# Patient Record
Sex: Female | Born: 1950 | Race: White | Hispanic: No | Smoking: Current some day smoker
Health system: Southern US, Community
[De-identification: ages and names within clinical notes are randomized; demographics above are authoritative.]

## PROBLEM LIST (undated history)

## (undated) DIAGNOSIS — I213 ST elevation (STEMI) myocardial infarction of unspecified site: Secondary | ICD-10-CM

## (undated) DIAGNOSIS — E669 Obesity, unspecified: Secondary | ICD-10-CM

## (undated) DIAGNOSIS — I10 Essential (primary) hypertension: Secondary | ICD-10-CM

## (undated) DIAGNOSIS — K30 Functional dyspepsia: Secondary | ICD-10-CM

## (undated) DIAGNOSIS — R079 Chest pain, unspecified: Secondary | ICD-10-CM

## (undated) DIAGNOSIS — I2510 Atherosclerotic heart disease of native coronary artery without angina pectoris: Secondary | ICD-10-CM

## (undated) HISTORY — DX: Functional dyspepsia: K30

## (undated) HISTORY — DX: Atherosclerotic heart disease of native coronary artery without angina pectoris: I25.10

## (undated) HISTORY — PX: AORTIC ARCH ANGIOGRAPHY: CATH118224

## (undated) HISTORY — DX: Obesity, unspecified: E66.9

## (undated) HISTORY — DX: Chest pain, unspecified: R07.9

## (undated) HISTORY — DX: Essential (primary) hypertension: I10

## (undated) HISTORY — DX: ST elevation (STEMI) myocardial infarction of unspecified site: I21.3

## (undated) HISTORY — PX: CORONARY ANGIOPLASTY WITH STENT PLACEMENT: SHX49

---

## 2020-09-20 ENCOUNTER — Telehealth: Payer: Self-pay | Admitting: General Practice

## 2020-09-20 NOTE — Telephone Encounter (Signed)
Patient is trying to schedule a new patient appointment. She has had a cardiologist before in [place] and was told to she needs a referral to schedule with [place]. She contacted her Cardiologist's office and they state that they need our office to fax over a request for records with our letterhead on it. You can fax that request to [phone] ATTN: Medical Records.

## 2020-11-15 ENCOUNTER — Telehealth: Payer: Self-pay

## 2020-11-15 NOTE — Telephone Encounter (Signed)
Received records for [facility] for new patient consult.

## 2020-12-01 ENCOUNTER — Encounter: Payer: Self-pay | Admitting: Interventional Cardiology

## 2020-12-01 ENCOUNTER — Ambulatory Visit: Payer: Medicare HMO | Admitting: Interventional Cardiology

## 2020-12-01 ENCOUNTER — Other Ambulatory Visit: Payer: Self-pay

## 2020-12-01 ENCOUNTER — Encounter (INDEPENDENT_AMBULATORY_CARE_PROVIDER_SITE_OTHER): Payer: Self-pay

## 2020-12-01 VITALS — BP 140/88 | HR 79 | Ht 65.0 in | Wt 211.0 lb

## 2020-12-01 DIAGNOSIS — I25118 Atherosclerotic heart disease of native coronary artery with other forms of angina pectoris: Secondary | ICD-10-CM

## 2020-12-01 DIAGNOSIS — I252 Old myocardial infarction: Secondary | ICD-10-CM | POA: Diagnosis not present

## 2020-12-01 DIAGNOSIS — E785 Hyperlipidemia, unspecified: Secondary | ICD-10-CM | POA: Diagnosis not present

## 2020-12-01 DIAGNOSIS — E039 Hypothyroidism, unspecified: Secondary | ICD-10-CM

## 2020-12-01 DIAGNOSIS — I10 Essential (primary) hypertension: Secondary | ICD-10-CM

## 2020-12-01 DIAGNOSIS — R5383 Other fatigue: Secondary | ICD-10-CM

## 2020-12-01 MED ORDER — CLOPIDOGREL BISULFATE 75 MG PO TABS: 75.0000 mg | ORAL_TABLET | Freq: Every day | ORAL | 3 refills | Status: DC

## 2020-12-01 MED ORDER — HYDROCHLOROTHIAZIDE 12.5 MG PO CAPS: 12.5000 mg | ORAL_CAPSULE | Freq: Every day | ORAL | 3 refills | Status: AC

## 2020-12-01 MED ORDER — ROSUVASTATIN CALCIUM 40 MG PO TABS: 40.0000 mg | ORAL_TABLET | Freq: Every day | ORAL | 3 refills | Status: AC

## 2020-12-01 MED ORDER — AVAPRO 150 MG PO TABS: 150.0000 mg | ORAL_TABLET | Freq: Every day | ORAL | 3 refills | Status: DC

## 2020-12-01 NOTE — Patient Instructions (Signed)
Medication Instructions:  1) DISCONTINUE Metoprolol 2) START Rosuvastatin 40mg  once daily 3) START Avapro 150mg  once daily 4) START Plavix 75mg  once daily 5) START Hydrochlorothiazide 12.5mg  once daily  *If you need a refill on your cardiac medications before your next appointment, please call your pharmacy*   Lab Work: Lipid, Liver, BMET and TSH in 1-2 weeks.  You will need to be fasting for these labs (nothing to eat or drink after midnight except water and black coffee).  If you have labs (blood work) drawn today and your tests are completely normal, you will receive your results only by: MyChart Message (if you have MyChart) OR . A paper copy in the mail If you have any lab test that is abnormal or we need to change your treatment, we will call you to review the results.   Testing/Procedures: None   Follow-Up: At [facility], you and your health needs are our priority.  As part of our continuing mission to provide you with exceptional heart care, we have created designated Provider Care Teams.  These Care Teams include your primary Cardiologist (physician) and Advanced Practice Providers (APPs -  Physician Assistants and Nurse Practitioners) who all work together to provide you with the care you need, when you need it.  We recommend signing up for the patient portal called "MyChart".  Sign up information is provided on this After Visit Summary.  MyChart is used to connect with patients for Virtual Visits (Telemedicine).  Patients are able to view lab/test results, encounter notes, upcoming appointments, etc.  Non-urgent messages can be sent to your provider as well.   To learn more about what you can do with MyChart, go to .    Your next appointment:   3 month(s)  The format for your next appointment:   In Person  Provider:   You may see Dr. Marland Kitchen or one of the following Advanced Practice Providers on your designated Care Team:     [facility], PA-C  [email or web address], PA-C    Other Instructions  You have been referred to Cardiac Rehab.

## 2020-12-01 NOTE — Progress Notes (Signed)
Cardiology Office Note   Date:  12/01/2020   ID:  Lacey Martin, DOB 1950/09/14, MRN [id number]  PCP:  Pcp, No    No chief complaint on file.  CAD  Wt Readings from Last 3 Encounters:  12/01/20 211 lb (95.7 kg)       History of Present Illness: Lacey Martin is a 70 y.o. female who is being seen today for the evaluation of CAD at the request of the patient.  Her sister is a patient here.   Review of prior records show that in November 2021, she presented to [facility] health in [facility] with burning indigestion.  ECG revealed inferior ST elevation MI.  She had 4.0 x 18 mm, 3.5 x 26 mm Resolute stents to the RCA. She underwent emergent catheterization and PTCA of the proximal and mid right coronary artery.  Echocardiogram done at that time showed LVEF 50 to 55%.  Normal mitral valve and aortic valve structure and function.  Normal aorta size.  No atrial level shunt.  Normal RV function.  Normal atrial sizes.  She was placed on Lipitor 80 mg daily.  She was discharged home the following day.  She was counseled to stop smoking.  At that time, LDL was 145.  Triglycerides 173.  Total cholesterol 223.  HDL 43.  Creatinine was 0.92.  She was sent home on atorvastatin 80 mg daily, aspirin 81 mg daily, metoprolol 25 mg twice daily, Brilinta 90 mg twice daily and losartan 50 mg daily.  She is here to have local follow-up in [place].  She is originally from [place], but moved to [place] in 3/22.   She retired from [employer] 4 years ago.   She is cut back on cigarette smoking.  Down to 2-3 cigs, from 1 ppd.   Since the MI, she has done well in general.  She never did cardiac rehab.  She does walk.  She is trying to lose weight.  She feels that her energy level is less.  She feels fatigued.     Past Medical History:  Diagnosis Date  . CAD (coronary artery disease)   . Chest pain   . Hypertension   . Indigestion   . Obesity   . ST elevation (STEMI) myocardial infarction [facility])         Current Outpatient Medications  Medication Sig Dispense Refill  . AVAPRO 150 MG tablet Take 1 tablet (150 mg total) by mouth daily. 90 tablet 3  . clopidogrel (PLAVIX) 75 MG tablet Take 1 tablet (75 mg total) by mouth daily. 90 tablet 3  . hydrochlorothiazide (MICROZIDE) 12.5 MG capsule Take 1 capsule (12.5 mg total) by mouth daily. 90 capsule 3  . rosuvastatin (CRESTOR) 40 MG tablet Take 1 tablet (40 mg total) by mouth daily. 90 tablet 3  . aspirin EC 81 MG tablet Take 81 mg by mouth daily. Swallow whole.    . clonazePAM (KLONOPIN) 0.5 MG tablet Take by mouth daily.    . metoprolol tartrate (LOPRESSOR) 25 MG tablet Take 25 mg by mouth 2 (two) times daily.     No current facility-administered medications for this visit.    Allergies:   Penicillins    Social History:  The patient  reports that she has been smoking. She has never used smokeless tobacco. She reports that she does not drink alcohol and does not use drugs.   Family History:  The patient's family history includes Heart disease in her sister.  ROS:  Please see the history of present illness.   Otherwise, review of systems are positive for nausea on meds, fatigue.   All other systems are reviewed and negative.    PHYSICAL EXAM: VS:  BP 140/88   Pulse 79   Wt 211 lb (95.7 kg)   SpO2 95%  , BMI There is no height or weight on file to calculate BMI. GEN: Well nourished, well developed, in no acute distress  HEENT: normal  Neck: no JVD, carotid bruits, or masses Cardiac: RRR; no murmurs, rubs, or gallops,no edema  Respiratory:  clear to auscultation bilaterally, normal work of breathing GI: soft, nontender, nondistended, + BS MS: no deformity or atrophy  Skin: warm and dry, no rash Neuro:  Strength and sensation are intact Psych: euthymic mood, full affect   EKG:   The ekg ordered today demonstrates NSR, inferior Q waves, nonspecific ST changes   Recent Labs: No results found for requested labs within  last 8760 hours.   Lipid Panel No results found for: CHOL, TRIG, HDL, CHOLHDL, VLDL, LDLCALC, LDLDIRECT   Other studies Reviewed: Additional studies/ records that were reviewed today with results demonstrating: Hospital records from [place] reviewed.   ASSESSMENT AND PLAN:  1. CAD/old MI: no angina.  Nonspecific fatigue.  Cardiac rehab. I spoke about the rationale of all of her cardiac meds.  Start Clopidogrel 75 mg daily.  SHe does not want DAPT due to her sister having a bleeding issue.  She will stop aspirin 1 week after starting clopidogrel. I explained the risk of stent thrombosis.  2. Hypertension: Low-salt diet.  Avoid processed foods.The current medical regimen is effective;  continue present plan and medications. She is not taking her losartan.  She wants to try avapro and wants a diuretic.  She "feels tight." Will ad HCTZ for BP as well.  3. Hyperlipidemia: Not taking atorvastatin.  She feels that she had nausea.  Will try rosuvastatin.   4. Tobacco abuse: She tried patches and gum in the past.  Trying to quit completely.  5. Hypothyroid: Check in TSH in a few weeks.  She no longer takes Synthroid.  THis may be the cause of her fatigue.   She will add the medicines one by one, starting with clopidogrel, and Avapro, then HCTZ, then rosuvastatin.  Bmet, liver, lipids, TSH in a few weeks after she has been on the Avapro and HCTZ.  Normal renal function noted in [place].   Current medicines are reviewed at length with the patient today.  The patient concerns regarding her medicines were addressed.  The following changes have been made:  No change  Labs/ tests ordered today include:   Orders Placed This Encounter  Procedures  . Lipid panel  . Hepatic function panel  . Basic metabolic panel  . TSH  . AMB referral to cardiac rehabilitation  . EKG 12-Lead    Recommend 150 minutes/week of aerobic exercise Low fat, low carb, high fiber diet recommended  Disposition:   FU in 3  months   Signed, Lance Muss, MD  12/01/2020 10:45 AM    [facility] [address] [place], [place], [place]  [address] Phone: [phone]; Fax: [phone]

## 2020-12-11 ENCOUNTER — Encounter (HOSPITAL_COMMUNITY): Payer: Self-pay | Admitting: *Deleted

## 2020-12-11 NOTE — Progress Notes (Addendum)
Received referral from Dr. Eldridge Dace for this pt to participate in Cardiac rehab s/p 05/2020 STEMI;DES x 1 RCA at [facility]. Clinical review for initial consult establishing care with Cardiology appt on 5/27 with Dr. Eldridge Dace. Pt appropriate for scheduling when able to due to extended waitlist for scheduling for on site cardiac rehab and/or enrollment in Virtual Cardiac Rehab.  Pt Covid Risk Score is 4.  Will forward to staff for follow up. Alanson Aly, BSN Cardiac and Emergency planning/management officer

## 2020-12-12 ENCOUNTER — Emergency Department (HOSPITAL_BASED_OUTPATIENT_CLINIC_OR_DEPARTMENT_OTHER)
Admission: EM | Admit: 2020-12-12 | Discharge: 2020-12-12 | Disposition: A | Discharge status: Home / Self Care | Payer: Medicare HMO | Attending: Emergency Medicine | Admitting: Emergency Medicine

## 2020-12-12 ENCOUNTER — Other Ambulatory Visit: Payer: Self-pay

## 2020-12-12 ENCOUNTER — Encounter (HOSPITAL_BASED_OUTPATIENT_CLINIC_OR_DEPARTMENT_OTHER): Payer: Self-pay

## 2020-12-12 ENCOUNTER — Emergency Department (HOSPITAL_BASED_OUTPATIENT_CLINIC_OR_DEPARTMENT_OTHER): Payer: Medicare HMO

## 2020-12-12 DIAGNOSIS — R202 Paresthesia of skin: Secondary | ICD-10-CM | POA: Diagnosis not present

## 2020-12-12 DIAGNOSIS — F17200 Nicotine dependence, unspecified, uncomplicated: Secondary | ICD-10-CM | POA: Diagnosis not present

## 2020-12-12 DIAGNOSIS — M549 Dorsalgia, unspecified: Secondary | ICD-10-CM | POA: Diagnosis not present

## 2020-12-12 DIAGNOSIS — R200 Anesthesia of skin: Secondary | ICD-10-CM

## 2020-12-12 DIAGNOSIS — I2510 Atherosclerotic heart disease of native coronary artery without angina pectoris: Secondary | ICD-10-CM | POA: Diagnosis not present

## 2020-12-12 DIAGNOSIS — I10 Essential (primary) hypertension: Secondary | ICD-10-CM | POA: Diagnosis not present

## 2020-12-12 DIAGNOSIS — G8929 Other chronic pain: Secondary | ICD-10-CM | POA: Diagnosis not present

## 2020-12-12 NOTE — ED Provider Notes (Signed)
Emergency Department Provider Note   I have reviewed the triage vital signs and the nursing notes.   HISTORY  Chief Complaint leg numbness   HPI Lacey Martin is a 70 y.o. female medical history of CAD, hypertension, elevated BMI presents to the emergency department with numbness in the right leg for the past 36 hours.  She first noticed it yesterday when she was drying off her leg after shower.  She states it felt like "nothing" which was jarring to her.  She did not feel weakness in the leg.  No trouble walking.  No arm or face symptoms.  No speech disturbance.  She initially felt like maybe it had something to do with her broken toe from earlier this year but symptoms go all the way up the leg.  She has chronic back pain but states is not worse than normal.  No bowel or bladder incontinence.  No groin numbness.   Past Medical History:  Diagnosis Date   CAD (coronary artery disease)    Chest pain    Hypertension    Indigestion    Obesity    ST elevation (STEMI) myocardial infarction (HCC)     There are no problems to display for this patient.   Past Surgical History:  Procedure Laterality Date   AORTIC ARCH ANGIOGRAPHY      Allergies Penicillins  Family History  Problem Relation Age of Onset   Heart disease Sister     Social History Social History   Tobacco Use   Smoking status: Current Some Day Smoker   Smokeless tobacco: Never Used   Tobacco comment: 2-3 daily  Substance Use Topics   Alcohol use: Never   Drug use: Never    Review of Systems  Constitutional: No fever/chills Eyes: No visual changes. ENT: No sore throat. Cardiovascular: Denies chest pain. Respiratory: Denies shortness of breath. Gastrointestinal: No abdominal pain.  No nausea, no vomiting.  No diarrhea.  No constipation. Genitourinary: Negative for dysuria. Musculoskeletal: Chronic back pain. Skin: Negative for rash. Neurological: Negative for headaches. Positive right leg numbness.    10-point ROS otherwise negative.  ____________________________________________   PHYSICAL EXAM:  VITAL SIGNS: ED Triage Vitals  Enc Vitals Group     BP 12/12/20 1622 (!) 173/104     Pulse Rate 12/12/20 1622 90     Resp 12/12/20 1622 18     Temp 12/12/20 1622 97.9 F (36.6 C)     Temp Source 12/12/20 1622 Oral     SpO2 12/12/20 1621 94 %     Weight 12/12/20 1623 210 lb 15.7 oz (95.7 kg)     Height 12/12/20 1623 5\' 5"  (1.651 m)   Constitutional: Alert and oriented. Well appearing and in no acute distress. Eyes: Conjunctivae are normal.  Head: Atraumatic. Nose: No congestion/rhinnorhea. Mouth/Throat: Mucous membranes are moist.  Neck: No stridor. Cardiovascular: Normal rate, regular rhythm. Good peripheral circulation. Grossly normal heart sounds.   Respiratory: Normal respiratory effort.  No retractions. Lungs CTAB. Gastrointestinal: Soft and nontender. No distention.  Musculoskeletal: No lower extremity tenderness nor edema. No gross deformities of extremities. Neurologic:  Normal speech and language. Decreased sensation to the right leg thigh to foot in multiple dermatomes. Normal strength/sensation in the RUE. No facial asymmetry.  Skin:  Skin is warm, dry and intact. No rash noted.   ____________________________________________   LABS (all labs ordered are listed, but only abnormal results are displayed)  Labs Reviewed  BASIC METABOLIC PANEL  CBC WITH DIFFERENTIAL/PLATELET  MAGNESIUM   ____________________________________________  RADIOLOGY  CT Head Wo Contrast  Result Date: 12/12/2020 CLINICAL DATA:  Decreased sensation in the right lower leg since yesterday. Clinical concern for cerebral infarction. EXAM: CT HEAD WITHOUT CONTRAST TECHNIQUE: Contiguous axial images were obtained from the base of the skull through the vertex without intravenous contrast. COMPARISON:  None. FINDINGS: Brain: Mildly enlarged ventricles and cortical sulci. Small, old lacunar infarct  or prominent perivascular space in the corona radiata on the right at the lateral margin of the corpus callosum. No intracranial hemorrhage, mass lesion or CT evidence of acute infarction. Vascular: No hyperdense vessel or unexpected calcification. Skull: Normal. Negative for fracture or focal lesion. Sinuses/Orbits: Unremarkable. Other: None. IMPRESSION: No acute abnormality. Electronically Signed   By: Beckie Salts M.D.   On: 12/12/2020 17:22    ____________________________________________   PROCEDURES  Procedure(s) performed:   Procedures  None  ____________________________________________   INITIAL IMPRESSION / ASSESSMENT AND PLAN / ED COURSE  Pertinent labs & imaging results that were available during my care of the patient were reviewed by me and considered in my medical decision making (see chart for details).   Patient presents to the emergency department with right leg numbness.  No weakness.  Symptoms are 36 hours from onset.  She is outside the window for any kind of stroke intervention.  Doubt spine related etiology with chronic pain and not worsening.  She does have decreased sensation in the leg on my exam.  No right arm or face symptoms.   CT imaging with no acute findings. Labs pending. Patient eloped prior to labs and consideration of transfer for MRI.  ____________________________________________  FINAL CLINICAL IMPRESSION(S) / ED DIAGNOSES  Final diagnoses:  Left leg numbness    Note:  This document was prepared using Dragon voice recognition software and may include unintentional dictation errors.  Alona Bene, MD, [facility] Emergency Medicine    Stori Royse, Arlyss Repress, MD 12/14/20 [phone]

## 2020-12-12 NOTE — ED Triage Notes (Addendum)
Patient reports decreased sensation to right lower leg since yesterday, denies pain, denies injury, does not effect her gait.  Patient reports "breaking her right big toe" about a month ago and had RLE pain after that, since resolved.  Was never seen for the toe injury.

## 2020-12-13 ENCOUNTER — Emergency Department (HOSPITAL_COMMUNITY)
Admission: EM | Admit: 2020-12-13 | Discharge: 2020-12-13 | Disposition: A | Discharge status: Home / Self Care | Payer: Medicare HMO | Attending: Physician Assistant | Admitting: Physician Assistant

## 2020-12-13 ENCOUNTER — Other Ambulatory Visit: Payer: Self-pay

## 2020-12-13 ENCOUNTER — Encounter (HOSPITAL_COMMUNITY): Payer: Self-pay

## 2020-12-13 DIAGNOSIS — R200 Anesthesia of skin: Secondary | ICD-10-CM | POA: Insufficient documentation

## 2020-12-13 DIAGNOSIS — Z5321 Procedure and treatment not carried out due to patient leaving prior to being seen by health care provider: Secondary | ICD-10-CM | POA: Diagnosis not present

## 2020-12-13 LAB — URINALYSIS, ROUTINE W REFLEX MICROSCOPIC
Bacteria, UA: NONE SEEN
Bilirubin Urine: NEGATIVE
Glucose, UA: 50 mg/dL — AB
Ketones, ur: NEGATIVE mg/dL
Leukocytes,Ua: NEGATIVE
Nitrite: NEGATIVE
Protein, ur: NEGATIVE mg/dL
Specific Gravity, Urine: 1.019 (ref 1.005–1.030)
pH: 6 (ref 5.0–8.0)

## 2020-12-13 LAB — COMPREHENSIVE METABOLIC PANEL
ALT: 106 U/L — ABNORMAL HIGH (ref 0–44)
AST: 74 U/L — ABNORMAL HIGH (ref 15–41)
Albumin: 3.9 g/dL (ref 3.5–5.0)
Alkaline Phosphatase: 134 U/L — ABNORMAL HIGH (ref 38–126)
Anion gap: 10 (ref 5–15)
BUN: 13 mg/dL (ref 8–23)
CO2: 23 mmol/L (ref 22–32)
Calcium: 9.6 mg/dL (ref 8.9–10.3)
Chloride: 104 mmol/L (ref 98–111)
Creatinine, Ser: 0.79 mg/dL (ref 0.44–1.00)
GFR, Estimated: >60 mL/min (ref >60)
Glucose, Bld: 198 mg/dL — ABNORMAL HIGH (ref 70–99)
Potassium: 4 mmol/L (ref 3.5–5.1)
Sodium: 137 mmol/L (ref 135–145)
Total Bilirubin: 0.3 mg/dL (ref 0.3–1.2)
Total Protein: 6.8 g/dL (ref 6.5–8.1)

## 2020-12-13 LAB — PROTIME-INR
INR: 1 (ref 0.8–1.2)
Prothrombin Time: 13.1 seconds (ref 11.4–15.2)

## 2020-12-13 LAB — DIFFERENTIAL
Abs Immature Granulocytes: 0.02 10*3/uL (ref 0.00–0.07)
Basophils Absolute: 0.1 10*3/uL (ref 0.0–0.1)
Basophils Relative: 1 %
Eosinophils Absolute: 0.2 10*3/uL (ref 0.0–0.5)
Eosinophils Relative: 3 %
Immature Granulocytes: 0 %
Lymphocytes Relative: 23 %
Lymphs Abs: 1.5 10*3/uL (ref 0.7–4.0)
Monocytes Absolute: 0.4 10*3/uL (ref 0.1–1.0)
Monocytes Relative: 6 %
Neutro Abs: 4.5 10*3/uL (ref 1.7–7.7)
Neutrophils Relative %: 67 %

## 2020-12-13 LAB — CBC
HCT: 44.5 % (ref 36.0–46.0)
Hemoglobin: 14.1 g/dL (ref 12.0–15.0)
MCH: 27.7 pg (ref 26.0–34.0)
MCHC: 31.7 g/dL (ref 30.0–36.0)
MCV: 87.4 fL (ref 80.0–100.0)
Platelets: 157 10*3/uL (ref 150–400)
RBC: 5.09 MIL/uL (ref 3.87–5.11)
RDW: 13.3 % (ref 11.5–15.5)
WBC: 6.8 10*3/uL (ref 4.0–10.5)
nRBC: 0 % (ref 0.0–0.2)

## 2020-12-13 LAB — APTT: aPTT: 31 seconds (ref 24–36)

## 2020-12-13 NOTE — ED Notes (Signed)
Patient states she had ct done yesterday and blood work today and she cant stay any longer d/t age and how long she might still potentially have to wait

## 2020-12-13 NOTE — ED Provider Notes (Signed)
Emergency Medicine Provider Triage Evaluation Note  Lacey Martin , a 70 y.o. female  was evaluated in triage.  Pt complains of numbness to the RLE that started 2 days ago. She further reports some mild weakness on that side as well. She was seen at [facility] yesterday and had a neg ct scan. She left prior to labwork and consideration for mri  Review of Systems  Positive: Right leg numbness, weakness Negative: Vision changes  Physical Exam  There were no vitals taken for this visit. Gen:   Awake, no distress   Resp:  Normal effort  MSK:   Moves extremities without difficulty  Other:  Decreased strength to the RLE, 5/5 strength to the bue/ble, cranial nerves II-XII intact  Medical Decision Making  Medically screening exam initiated at 9:13 PM.  Appropriate orders placed.  DAYAMI TAITT was informed that the remainder of the evaluation will be completed by another provider, this initial triage assessment does not replace that evaluation, and the importance of remaining in the ED until their evaluation is complete.   Rayne Du 12/13/20 2113    Pricilla Loveless, MD 12/13/20 [phone]

## 2020-12-13 NOTE — ED Triage Notes (Signed)
Pt reports that for the last two days she has been having numbness in her R leg, states feels a little weaker.

## 2020-12-15 ENCOUNTER — Telehealth: Payer: Self-pay | Admitting: *Deleted

## 2020-12-15 ENCOUNTER — Other Ambulatory Visit: Payer: Medicare HMO | Admitting: *Deleted

## 2020-12-15 ENCOUNTER — Other Ambulatory Visit: Payer: Self-pay

## 2020-12-15 DIAGNOSIS — R7401 Elevation of levels of liver transaminase levels: Secondary | ICD-10-CM

## 2020-12-15 DIAGNOSIS — E785 Hyperlipidemia, unspecified: Secondary | ICD-10-CM

## 2020-12-15 LAB — BASIC METABOLIC PANEL
BUN/Creatinine Ratio: 19 (ref 12–28)
BUN: 15 mg/dL (ref 8–27)
CO2: 21 mmol/L (ref 20–29)
Calcium: 9.7 mg/dL (ref 8.7–10.3)
Chloride: 101 mmol/L (ref 96–106)
Creatinine, Ser: 0.79 mg/dL (ref 0.57–1.00)
Glucose: 214 mg/dL — ABNORMAL HIGH (ref 65–99)
Potassium: 4.1 mmol/L (ref 3.5–5.2)
Sodium: 138 mmol/L (ref 134–144)
eGFR: 81 mL/min/{1.73_m2} (ref >59)

## 2020-12-15 LAB — HEPATIC FUNCTION PANEL
ALT: 94 IU/L — ABNORMAL HIGH (ref 0–32)
AST: 48 IU/L — ABNORMAL HIGH (ref 0–40)
Albumin: 4.2 g/dL (ref 3.8–4.8)
Alkaline Phosphatase: 141 IU/L — ABNORMAL HIGH (ref 44–121)
Bilirubin Total: 0.5 mg/dL (ref 0.0–1.2)
Bilirubin, Direct: 0.21 mg/dL (ref 0.00–0.40)
Total Protein: 6.5 g/dL (ref 6.0–8.5)

## 2020-12-15 LAB — LIPID PANEL
Chol/HDL Ratio: 3.5 ratio (ref 0.0–4.4)
Cholesterol, Total: 179 mg/dL (ref 100–199)
HDL: 51 mg/dL (ref >39)
LDL Chol Calc (NIH): 101 mg/dL — ABNORMAL HIGH (ref 0–99)
Triglycerides: 154 mg/dL — ABNORMAL HIGH (ref 0–149)
VLDL Cholesterol Cal: 27 mg/dL (ref 5–40)

## 2020-12-15 LAB — TSH: TSH: 5.65 u[IU]/mL — ABNORMAL HIGH (ref 0.450–4.500)

## 2020-12-15 NOTE — Telephone Encounter (Signed)
I spoke with patient and reviewed lab results with her.  She has synthroid at home so will resume.  She does not have a PCP but will be seeing a NP at [facility] in a couple of weeks.  Dr Eldridge Dace would like to recheck liver function tests in 2-3 weeks.  Patient will come in for lab work on June 27,2022.   Patient reports she has been having numbness on right side.  Was evaluated recently in ED for this.  I advised her to go back to ED if her symptoms worsened.

## 2020-12-15 NOTE — Telephone Encounter (Signed)
-----   Message from Corky Crafts, MD sent at 12/15/2020  5:32 PM EDT ----- Still showing that her thyroid is underactive and she needs synthroid.  I think she had stopped taking this medicine in a general effort to take less medicine. Please cc her PMD.

## 2021-01-01 ENCOUNTER — Other Ambulatory Visit: Payer: Medicare HMO

## 2021-01-04 ENCOUNTER — Telehealth (HOSPITAL_COMMUNITY): Payer: Self-pay

## 2021-01-04 NOTE — Telephone Encounter (Signed)
Attempted to call patient in regards to Cardiac Rehab - LM on VM 

## 2021-01-04 NOTE — Telephone Encounter (Signed)
Called patient to see if she is interested in the Cardiac Rehab Program. Patient expressed interest. Explained scheduling process and went over insurance, patient verbalized understanding. Will contact patient at a later time to schedule.

## 2021-01-04 NOTE — Telephone Encounter (Signed)
Pt insurance is active and benefits verified through [facility] Co-pay $45, DED 0/0 met, out of pocket $4,500/$262.42 met, co-insurance 0%. no pre-authorization required. Passport, 01/04/2021@9 :45am, REF# [id number]   Will contact patient to see if she is interested in the Cardiac Rehab Program. If interested, patient will need to complete follow up appt. Once completed, patient will be contacted for scheduling upon review by the RN Navigator.

## 2021-01-10 ENCOUNTER — Encounter (HOSPITAL_COMMUNITY): Payer: Self-pay

## 2021-01-10 NOTE — Telephone Encounter (Signed)
Attempted to call patient in regards to Cardiac Rehab - LM on VM Mailed letter 

## 2021-02-10 ENCOUNTER — Emergency Department (HOSPITAL_BASED_OUTPATIENT_CLINIC_OR_DEPARTMENT_OTHER)
Admission: EM | Admit: 2021-02-10 | Discharge: 2021-02-10 | Disposition: A | Discharge status: Home / Self Care | Payer: Medicare HMO | Attending: Emergency Medicine | Admitting: Emergency Medicine

## 2021-02-10 ENCOUNTER — Emergency Department (HOSPITAL_BASED_OUTPATIENT_CLINIC_OR_DEPARTMENT_OTHER): Payer: Medicare HMO | Admitting: Radiology

## 2021-02-10 ENCOUNTER — Emergency Department (HOSPITAL_BASED_OUTPATIENT_CLINIC_OR_DEPARTMENT_OTHER): Payer: Medicare HMO

## 2021-02-10 ENCOUNTER — Encounter (HOSPITAL_BASED_OUTPATIENT_CLINIC_OR_DEPARTMENT_OTHER): Payer: Self-pay

## 2021-02-10 DIAGNOSIS — R0789 Other chest pain: Secondary | ICD-10-CM | POA: Diagnosis not present

## 2021-02-10 DIAGNOSIS — Z955 Presence of coronary angioplasty implant and graft: Secondary | ICD-10-CM | POA: Insufficient documentation

## 2021-02-10 DIAGNOSIS — I2510 Atherosclerotic heart disease of native coronary artery without angina pectoris: Secondary | ICD-10-CM | POA: Diagnosis not present

## 2021-02-10 DIAGNOSIS — Z7982 Long term (current) use of aspirin: Secondary | ICD-10-CM | POA: Diagnosis not present

## 2021-02-10 DIAGNOSIS — F17200 Nicotine dependence, unspecified, uncomplicated: Secondary | ICD-10-CM | POA: Diagnosis not present

## 2021-02-10 DIAGNOSIS — Z79899 Other long term (current) drug therapy: Secondary | ICD-10-CM | POA: Insufficient documentation

## 2021-02-10 DIAGNOSIS — R079 Chest pain, unspecified: Secondary | ICD-10-CM

## 2021-02-10 DIAGNOSIS — I10 Essential (primary) hypertension: Secondary | ICD-10-CM | POA: Insufficient documentation

## 2021-02-10 DIAGNOSIS — Z7902 Long term (current) use of antithrombotics/antiplatelets: Secondary | ICD-10-CM | POA: Diagnosis not present

## 2021-02-10 LAB — CBC
HCT: 43.9 % (ref 36.0–46.0)
Hemoglobin: 14.3 g/dL (ref 12.0–15.0)
MCH: 28.2 pg (ref 26.0–34.0)
MCHC: 32.6 g/dL (ref 30.0–36.0)
MCV: 86.6 fL (ref 80.0–100.0)
Platelets: 157 10*3/uL (ref 150–400)
RBC: 5.07 MIL/uL (ref 3.87–5.11)
RDW: 14.3 % (ref 11.5–15.5)
WBC: 5.8 10*3/uL (ref 4.0–10.5)
nRBC: 0 % (ref 0.0–0.2)

## 2021-02-10 LAB — BASIC METABOLIC PANEL
Anion gap: 8 (ref 5–15)
BUN: 16 mg/dL (ref 8–23)
CO2: 25 mmol/L (ref 22–32)
Calcium: 9.5 mg/dL (ref 8.9–10.3)
Chloride: 106 mmol/L (ref 98–111)
Creatinine, Ser: 0.79 mg/dL (ref 0.44–1.00)
GFR, Estimated: >60 mL/min (ref >60)
Glucose, Bld: 170 mg/dL — ABNORMAL HIGH (ref 70–99)
Potassium: 3.6 mmol/L (ref 3.5–5.1)
Sodium: 139 mmol/L (ref 135–145)

## 2021-02-10 LAB — D-DIMER, QUANTITATIVE: D-Dimer, Quant: 0.56 ug/mL-FEU — ABNORMAL HIGH (ref 0.00–0.50)

## 2021-02-10 LAB — TROPONIN I (HIGH SENSITIVITY)
Troponin I (High Sensitivity): 7 ng/L (ref <18)
Troponin I (High Sensitivity): 6 ng/L (ref <18)

## 2021-02-10 MED ORDER — NITROGLYCERIN 0.4 MG SL SUBL
0.4000 mg | SUBLINGUAL_TABLET | SUBLINGUAL | Status: DC | PRN
  Administered 2021-02-10 (×3): 0.4 mg via SUBLINGUAL
  Filled 2021-02-10: qty 1

## 2021-02-10 MED ORDER — IOHEXOL 350 MG/ML SOLN
100.0000 mL | Freq: Once | INTRAVENOUS | Status: AC | PRN
  Administered 2021-02-10: 100 mL via INTRAVENOUS

## 2021-02-10 MED ORDER — FENTANYL CITRATE (PF) 100 MCG/2ML IJ SOLN
50.0000 ug | Freq: Once | INTRAMUSCULAR | Status: AC
  Administered 2021-02-10: 50 ug via INTRAVENOUS
  Filled 2021-02-10: qty 2

## 2021-02-10 MED ORDER — ASPIRIN 81 MG PO CHEW
324.0000 mg | CHEWABLE_TABLET | Freq: Once | ORAL | Status: AC
  Administered 2021-02-10: 324 mg via ORAL
  Filled 2021-02-10: qty 4

## 2021-02-10 NOTE — ED Notes (Signed)
After Dr. Christoper Fabian consult, she spoke at some length with her son; and she ultimately decides to be d/c home. She remains in no distress.

## 2021-02-10 NOTE — ED Triage Notes (Signed)
She states she experienced left-sided chest discomfort after arising to go to the bathroom. She describes it as "It feels like really bad indigestion". Her skin is normal, warm and dry and she is breathing normally.

## 2021-02-10 NOTE — Discharge Instructions (Addendum)
Contact your cardiologist on Monday.  Return to the emergency room if you have any worsening symptoms.

## 2021-02-10 NOTE — ED Provider Notes (Signed)
[facility] EMERGENCY DEPT Provider Note   CSN: [id number] Arrival date & time: 02/10/21  0701     History Chief Complaint  Patient presents with   Chest Pain    Lacey Martin is a 70 y.o. female.  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  This happened in [place].  She presents today with chest pain.  She says it started about 5:00 she was walking to the bathroom this morning.  She describes it as an indigestion type pain in her left chest.  It radiates a little bit down her left arm.  She does not report any shortness of breath but says it hurts a little bit more when she takes a deep breath.  It does not seem to be worse with exertion.  There is no diaphoresis.  She says it feels little different than her prior MI.  She did take a Klonopin prior to arrival but it did not help with the symptoms.  She denies any cough or cold symptoms.  No associated back pain.  No leg pain or swelling.      Past Medical History:  Diagnosis Date   CAD (coronary artery disease)    Chest pain    Hypertension    Indigestion    Obesity    ST elevation (STEMI) myocardial infarction (HCC)     There are no problems to display for this patient.   Past Surgical History:  Procedure Laterality Date   AORTIC ARCH ANGIOGRAPHY     CORONARY ANGIOPLASTY WITH STENT PLACEMENT       OB History   No obstetric history on file.     Family History  Problem Relation Age of Onset   Heart disease Sister     Social History   Tobacco Use   Smoking status: Some Days   Smokeless tobacco: Never   Tobacco comments:    2-3 daily  Substance Use Topics   Alcohol use: Never   Drug use: Never    Home Medications Prior to Admission medications   Medication Sig Start Date End Date Taking? Authorizing Provider  aspirin EC 81 MG tablet Take 81 mg by mouth daily. Swallow whole.    [provider]  AVAPRO 150 MG tablet Take 1 tablet (150 mg total) by mouth daily. 12/01/20   Corky Crafts, MD  clonazePAM (KLONOPIN) 0.5 MG tablet Take by mouth daily.    [provider]  clopidogrel (PLAVIX) 75 MG tablet Take 1 tablet (75 mg total) by mouth daily. 12/01/20   Corky Crafts, MD  hydrochlorothiazide (MICROZIDE) 12.5 MG capsule Take 1 capsule (12.5 mg total) by mouth daily. 12/01/20   Corky Crafts, MD  metoprolol tartrate (LOPRESSOR) 25 MG tablet Take 25 mg by mouth 2 (two) times daily.    [provider]  rosuvastatin (CRESTOR) 40 MG tablet Take 1 tablet (40 mg total) by mouth daily. 12/01/20   Corky Crafts, MD    Allergies    Penicillins  Review of Systems   Review of Systems  Constitutional:  Negative for chills, diaphoresis, fatigue and fever.  HENT:  Negative for congestion, rhinorrhea and sneezing.   Eyes: Negative.   Respiratory:  Negative for cough, chest tightness and shortness of breath.   Cardiovascular:  Positive for chest pain. Negative for leg swelling.  Gastrointestinal:  Negative for abdominal pain, blood in stool, diarrhea, nausea and vomiting.  Genitourinary:  Negative for difficulty urinating, flank pain, frequency and hematuria.  Musculoskeletal:  Negative for arthralgias and back pain.  Skin:  Negative for rash.  Neurological:  Negative for dizziness, speech difficulty, weakness, numbness and headaches.   Physical Exam Updated Vital Signs BP (!) 149/83   Pulse 65   Temp 98.4 F (36.9 C) (Oral)   Resp 14   Ht 5\' 6"  (1.676 m)   Wt 89.4 kg   SpO2 99%   BMI 31.80 kg/m   Physical Exam Constitutional:      Appearance: She is well-developed.  HENT:     Head: Normocephalic and atraumatic.  Eyes:     Pupils: Pupils are equal, round, and reactive to light.  Cardiovascular:     Rate and Rhythm: Normal rate and regular rhythm.     Heart sounds: Normal heart sounds.  Pulmonary:     Effort: Pulmonary effort is  normal. No respiratory distress.     Breath sounds: Normal breath sounds. No wheezing or rales.  Chest:     Chest wall: No tenderness.  Abdominal:     General: Bowel sounds are normal.     Palpations: Abdomen is soft.     Tenderness: There is no abdominal tenderness. There is no guarding or rebound.  Musculoskeletal:        General: Normal range of motion.     Cervical back: Normal range of motion and neck supple.     Comments: No edema or calf tenderness  Lymphadenopathy:     Cervical: No cervical adenopathy.  Skin:    General: Skin is warm and dry.     Findings: No rash.  Neurological:     Mental Status: She is alert and oriented to person, place, and time.    ED Results / Procedures / Treatments   Labs (all labs ordered are listed, but only abnormal results are displayed) Labs Reviewed  BASIC METABOLIC PANEL - Abnormal; Notable for the following components:      Result Value   Glucose, Bld 170 (*)    All other components within normal limits  D-DIMER, QUANTITATIVE - Abnormal; Notable for the following components:   D-Dimer, Quant 0.56 (*)    All other components within normal limits  CBC  TROPONIN I (HIGH SENSITIVITY)  TROPONIN I (HIGH SENSITIVITY)    EKG EKG Interpretation  Date/Time:  Saturday February 10 2021 08:06:32 EDT Ventricular Rate:  73 PR Interval:  193 QRS Duration: 91 QT Interval:  402 QTC Calculation: 443 R Axis:   -27 Text Interpretation: Sinus rhythm Inferior infarct, old Anterior infarct, old Confirmed by Rolan BuccoBelfi, Yesha Muchow [id number]) on 02/10/2021 8:30:33 AM  Radiology DG Chest 2 View  Result Date: 02/10/2021 CLINICAL DATA:  Chest pain. EXAM: CHEST - 2 VIEW COMPARISON:  None. FINDINGS: Few patchy densities near the left costophrenic angle. 10 mm nodular density at the medial right lung base. This is larger than the adjacent vascular structures and suspect this is a pulmonary nodule. There may be an additional 5 mm nodule in the right mid lung which could  be calcified. Heart size is upper limits of normal. Negative for a pneumothorax. Trachea is midline. No large pleural effusions. IMPRESSION: 1. Subtle densities at the left costophrenic angle. Differential diagnosis includes small focus of infection versus atelectasis. 2. Possible 1 cm nodule near the medial right lung base. Recommend further characterization with a chest CT. Electronically Signed   By: Richarda OverlieAdam  Henn M.D.   On: 02/10/2021 08:34  CT Angio Chest PE W/Cm &/Or Wo Cm  Result Date: 02/10/2021 CLINICAL DATA:  70 year old female with LEFT chest pain and positive D-dimer. EXAM: CT ANGIOGRAPHY CHEST WITH CONTRAST TECHNIQUE: Multidetector CT imaging of the chest was performed using the standard protocol during bolus administration of intravenous contrast. Multiplanar CT image reconstructions and MIPs were obtained to evaluate the vascular anatomy. CONTRAST:  OMNIPAQUE IOHEXOL 350 MG/ML SOLN COMPARISON:  Chest radiograph performed today FINDINGS: Cardiovascular: This is a technically adequate study for evaluation of pulmonary emboli. No pulmonary emboli are identified. Ectasias of the thoracic aorta noted with ascending aorta measuring up to 3.8 cm. Coronary artery and aortic atherosclerotic calcifications are present. No pericardial effusion identified. Mediastinum/Nodes: No enlarged mediastinal, hilar, or axillary lymph nodes. Calcified lymph nodes are present, compatible with prior granulomatous disease. Thyroid gland, trachea, and esophagus demonstrate no significant findings. Lungs/Pleura: Mild to moderate bibasilar atelectasis is noted. Calcified granuloma at the RIGHT lung base corresponds to the nodular opacity identified on today's chest radiograph. No pulmonary mass or suspicious nodule identified. No airspace disease, pleural effusion or pneumothorax noted. Upper Abdomen: Hepatic steatosis identified. Equivocal nodularity of the liver is identified with UPPER limits normal spleen size. No  acute abnormalities are present. Musculoskeletal: No acute or suspicious bony abnormalities noted. Review of the MIP images confirms the above findings. IMPRESSION: 1. No evidence of pulmonary emboli. 2. Mild to moderate bibasilar atelectasis. Nodule at the RIGHT lung base on recent chest radiograph represents a benign calcified granuloma. 3. Hepatic steatosis. Equivocal nodularity of the liver and UPPER limits normal spleen size which may suggest cirrhosis. 4. Coronary artery disease. 5. Aortic Atherosclerosis (ICD10-I70.0). Electronically Signed   By: Harmon Pier M.D.   On: 02/10/2021 10:24    Procedures Procedures   Medications Ordered in ED Medications  nitroGLYCERIN (NITROSTAT) SL tablet 0.4 mg (0.4 mg Sublingual Given 02/10/21 0816)  aspirin chewable tablet 324 mg (324 mg Oral Given 02/10/21 0806)  iohexol (OMNIPAQUE) 350 MG/ML injection 100 mL (100 mLs Intravenous Contrast Given 02/10/21 1009)  fentaNYL (SUBLIMAZE) injection 50 mcg (50 mcg Intravenous Given 02/10/21 0936)    ED Course  I have reviewed the triage vital signs and the nursing notes.  Pertinent labs & imaging results that were available during my care of the patient were reviewed by me and considered in my medical decision making (see chart for details).    MDM Rules/Calculators/A&P                           Patient is a 70 year old female who presents with chest pain.  Her EKG does not show any ischemic changes.  She has had 2 negative troponins.  Her D-dimer is slightly elevated and she had a CT scan of her chest which showed no evidence of PE or pneumonia.  Her other labs are nonconcerning.  She did have an elevated heart score and given that she had a recent MI with stent placement, I did recommend that she be admitted to the hospital for further evaluation.  Initially she was uncertain and was thinking about it.  Her son was favoring her being admitted.  She then decided that she was not could be admitted to the hospital.  I  had a long discussion with her and her son regarding the limitations of our study and the reason for admission.  I did advise her that if she leaves today that she would have increased risk for adverse events.  She says she will follow-up with her cardiologist on Monday.  Return precautions were given. Final Clinical Impression(s) / ED Diagnoses Final diagnoses:  Chest pain, unspecified type    Rx / DC Orders ED Discharge Orders     None        Rolan Bucco, MD 02/10/21 1149

## 2021-02-10 NOTE — ED Notes (Signed)
Lab aware to add on D-Dimer test.

## 2021-02-10 NOTE — ED Notes (Signed)
Dr. Fredderick Phenix is made aware of no pain relief from three s.l. ntg tablets.

## 2021-02-10 NOTE — ED Notes (Signed)
Pt transported to bathroom via wheelchair, tolerated well. Transported to CT.

## 2021-02-10 NOTE — ED Notes (Signed)
Rrepeat EKG performed before nitro.

## 2021-02-10 NOTE — ED Notes (Signed)
She returns from CT at this time. She smilingly tells me she feels "better".

## 2021-02-15 ENCOUNTER — Telehealth (HOSPITAL_COMMUNITY): Payer: Self-pay | Admitting: *Deleted

## 2021-02-15 NOTE — Telephone Encounter (Signed)
Called pt 02/15/11. We have Lacey Martin scheduled for PR orientation 02/20/21. In reviewing her records found that she had been in the ED 02/10/21. Called her to get a update on what has happened since she had been in the ED. I was unable to reach her but left a message for her to return the call.

## 2021-02-16 ENCOUNTER — Encounter (HOSPITAL_COMMUNITY): Payer: Self-pay | Admitting: *Deleted

## 2021-02-16 ENCOUNTER — Telehealth (HOSPITAL_COMMUNITY): Payer: Self-pay | Admitting: *Deleted

## 2021-02-16 NOTE — Telephone Encounter (Signed)
Pt has not responded to message left on this past Wednesday 8/10. Called today for follow up and the mailbox is full and I am unable to leave a message.  Sent pt message through [place] requesting please contact regarding her cancelled appt for Cardiac rehab.  Pt must complete post ER visit Cardiology follow up.  Lacey Martin has a previously scheduled appt for 8/25.  I do not see that Dr. Eldridge Dace was contacted per directions given to [name] to do so. Alanson Aly, BSN Cardiac and Emergency planning/management officer

## 2021-02-19 ENCOUNTER — Telehealth (HOSPITAL_COMMUNITY): Payer: Self-pay | Admitting: *Deleted

## 2021-02-19 NOTE — Telephone Encounter (Signed)
Nikolina received message and called me back.  I advised pt that due to her ER visit on 8/6 for chest pain, she will need to complete follow up appt - previously scheduled on 8/25.  Channelle became very upset verbally and indicated that her visit to the ER was GI related and not her heart.  Reviewed with pt the ER progress note which states that she was encouraged to stay overnight for observation and she declined. Pt stated, "That is a lie and it never happened.:  I can;t believe this is happening - this is why I don't trust anyone in the medical field."  "All doctors want to do is give you a pill for this a pill for that."  Tried to interject pt continued to speak with profanity.  Pami asked me if I do come to Cardiac rehab what lies are you going to write about me.  I stated firmly that I do not document anything that is not true or didn't happen. Antonette stated the she did not want to participate in cardiac rehab "I am done with the doctors around here, I am going to stay with my [facility] doctors." Acknowledged to pt that I hear what she is saying and I understand the mistrust of health care professionals that unfortunately happens as a result of "bad apples".  This os true for many professions.  Advised pt on walking 30-45 minutes a day mindful of the temperature and humidly.  Boots remarked that she lives in a beautiful apartment complex that has a fitness center. She will continue to exercise on her own.Alanson Aly, BSN Cardiac and Emergency planning/management officer

## 2021-02-19 NOTE — Telephone Encounter (Signed)
No response from pt with message left on voicemail and message by MyChart.  Called pt emergency contact - Zadie Cleverly - sister. Clydie Braun indicated that Farrin is having difficulty with her phone.  Confidential message given to sister to "please contact [phone] and ask for Jasime Westergren".  Clydie Braun indicated that she did not have any alternate way to reach her sister but would get the message to her. Alanson Aly, BSN Cardiac and Emergency planning/management officer

## 2021-02-20 ENCOUNTER — Inpatient Hospital Stay (HOSPITAL_COMMUNITY): Admission: RE | Admit: 2021-02-20 | Payer: Medicare HMO | Source: Ambulatory Visit

## 2021-02-21 ENCOUNTER — Other Ambulatory Visit: Payer: Self-pay

## 2021-02-21 ENCOUNTER — Emergency Department (HOSPITAL_BASED_OUTPATIENT_CLINIC_OR_DEPARTMENT_OTHER): Payer: Medicare HMO

## 2021-02-21 ENCOUNTER — Encounter (HOSPITAL_BASED_OUTPATIENT_CLINIC_OR_DEPARTMENT_OTHER): Payer: Self-pay

## 2021-02-21 ENCOUNTER — Emergency Department (HOSPITAL_BASED_OUTPATIENT_CLINIC_OR_DEPARTMENT_OTHER)
Admission: EM | Admit: 2021-02-21 | Discharge: 2021-02-21 | Disposition: A | Discharge status: Home / Self Care | Payer: Medicare HMO | Attending: Emergency Medicine | Admitting: Emergency Medicine

## 2021-02-21 DIAGNOSIS — I10 Essential (primary) hypertension: Secondary | ICD-10-CM | POA: Insufficient documentation

## 2021-02-21 DIAGNOSIS — R109 Unspecified abdominal pain: Secondary | ICD-10-CM | POA: Diagnosis not present

## 2021-02-21 DIAGNOSIS — Z7902 Long term (current) use of antithrombotics/antiplatelets: Secondary | ICD-10-CM | POA: Insufficient documentation

## 2021-02-21 DIAGNOSIS — M545 Low back pain, unspecified: Secondary | ICD-10-CM | POA: Insufficient documentation

## 2021-02-21 DIAGNOSIS — I2510 Atherosclerotic heart disease of native coronary artery without angina pectoris: Secondary | ICD-10-CM | POA: Diagnosis not present

## 2021-02-21 DIAGNOSIS — F17200 Nicotine dependence, unspecified, uncomplicated: Secondary | ICD-10-CM | POA: Diagnosis not present

## 2021-02-21 DIAGNOSIS — Z79899 Other long term (current) drug therapy: Secondary | ICD-10-CM | POA: Insufficient documentation

## 2021-02-21 DIAGNOSIS — Z7982 Long term (current) use of aspirin: Secondary | ICD-10-CM | POA: Insufficient documentation

## 2021-02-21 LAB — URINALYSIS, ROUTINE W REFLEX MICROSCOPIC
Bilirubin Urine: NEGATIVE
Glucose, UA: NEGATIVE mg/dL
Ketones, ur: NEGATIVE mg/dL
Leukocytes,Ua: NEGATIVE
Nitrite: NEGATIVE
Specific Gravity, Urine: 1.017 (ref 1.005–1.030)
pH: 6 (ref 5.0–8.0)

## 2021-02-21 LAB — COMPREHENSIVE METABOLIC PANEL
ALT: 56 U/L — ABNORMAL HIGH (ref 0–44)
AST: 30 U/L (ref 15–41)
Albumin: 4.2 g/dL (ref 3.5–5.0)
Alkaline Phosphatase: 110 U/L (ref 38–126)
Anion gap: 9 (ref 5–15)
BUN: 15 mg/dL (ref 8–23)
CO2: 27 mmol/L (ref 22–32)
Calcium: 10.8 mg/dL — ABNORMAL HIGH (ref 8.9–10.3)
Chloride: 102 mmol/L (ref 98–111)
Creatinine, Ser: 0.77 mg/dL (ref 0.44–1.00)
GFR, Estimated: >60 mL/min (ref >60)
Glucose, Bld: 174 mg/dL — ABNORMAL HIGH (ref 70–99)
Potassium: 3.9 mmol/L (ref 3.5–5.1)
Sodium: 138 mmol/L (ref 135–145)
Total Bilirubin: 0.5 mg/dL (ref 0.3–1.2)
Total Protein: 6.9 g/dL (ref 6.5–8.1)

## 2021-02-21 LAB — CBC WITH DIFFERENTIAL/PLATELET
Abs Immature Granulocytes: 0.03 10*3/uL (ref 0.00–0.07)
Basophils Absolute: 0.1 10*3/uL (ref 0.0–0.1)
Basophils Relative: 1 %
Eosinophils Absolute: 0.3 10*3/uL (ref 0.0–0.5)
Eosinophils Relative: 4 %
HCT: 43.3 % (ref 36.0–46.0)
Hemoglobin: 14.2 g/dL (ref 12.0–15.0)
Immature Granulocytes: 1 %
Lymphocytes Relative: 26 %
Lymphs Abs: 1.7 10*3/uL (ref 0.7–4.0)
MCH: 28.2 pg (ref 26.0–34.0)
MCHC: 32.8 g/dL (ref 30.0–36.0)
MCV: 86.1 fL (ref 80.0–100.0)
Monocytes Absolute: 0.7 10*3/uL (ref 0.1–1.0)
Monocytes Relative: 10 %
Neutro Abs: 3.8 10*3/uL (ref 1.7–7.7)
Neutrophils Relative %: 58 %
Platelets: 147 10*3/uL — ABNORMAL LOW (ref 150–400)
RBC: 5.03 MIL/uL (ref 3.87–5.11)
RDW: 14.2 % (ref 11.5–15.5)
WBC: 6.6 10*3/uL (ref 4.0–10.5)
nRBC: 0 % (ref 0.0–0.2)

## 2021-02-21 LAB — LIPASE, BLOOD: Lipase: 13 U/L (ref 11–51)

## 2021-02-21 MED ORDER — MORPHINE SULFATE (PF) 4 MG/ML IV SOLN
4.0000 mg | Freq: Once | INTRAVENOUS | Status: DC
  Filled 2021-02-21: qty 1

## 2021-02-21 MED ORDER — ONDANSETRON HCL 4 MG/2ML IJ SOLN
4.0000 mg | Freq: Once | INTRAMUSCULAR | Status: AC
  Administered 2021-02-21: 4 mg via INTRAVENOUS
  Filled 2021-02-21: qty 2

## 2021-02-21 MED ORDER — METHOCARBAMOL 500 MG PO TABS: 500.0000 mg | ORAL_TABLET | Freq: Three times a day (TID) | ORAL | 0 refills | Status: AC | PRN

## 2021-02-21 MED ORDER — KETOROLAC TROMETHAMINE 30 MG/ML IJ SOLN
15.0000 mg | Freq: Once | INTRAMUSCULAR | Status: AC
  Administered 2021-02-21: 15 mg via INTRAVENOUS
  Filled 2021-02-21: qty 1

## 2021-02-21 NOTE — ED Notes (Signed)
Bilateral BP done. Right arm 159/80 (103). Left arm 163/89 (110).

## 2021-02-21 NOTE — Discharge Instructions (Addendum)
As we discussed, you may have passed a kidney stone. Your CT does not show any kidney stones currently. You declined a CT scan with contrast which would have given more information about the blood vessels. CT did show a nodule in the base of your left lung that should be assessed again in one year.  Take anti-inflammatories and muscle relaxers as prescribed.  Follow-up with your doctor.  Return to the ED with worsening pain, focal weakness, numbness, tingling, bowel or bladder incontinence, chest pain, shortness of breath, any other concerns

## 2021-02-21 NOTE — ED Provider Notes (Signed)
[facility] EMERGENCY DEPT Provider Note   CSN: [id number] Arrival date & time: 02/21/21  0006     History Chief Complaint  Patient presents with   Flank Pain    Left    Lacey Martin is a 70 y.o. female.  Patient with history of CAD with stents, hypertension, diabetes here with left-sided low back pain that onset this evening around 8:30 PM.  States she was driving in her car from [place] and noticed pain when she got out of the car. did not have any injury or fall.  Pain was intermittent but became more severe after she woke up from a nap this evening.  She took Klonopin with partial relief.  The pain is sharp and stabbing in her left flank and low back.  Denies any radiation of the pain down her butt or down her leg.  No bowel or bladder incontinence.  No focal weakness, numbness or tingling.  Has chronic right-sided leg numbness which is unchanged.  No fever or vomiting.  No chest pain or shortness of breath.  No pain with urination or blood in the urine.  No abdominal pain.  She is never had this pain in the past. Concerned she could have a kidney infection or kidney stone  The history is provided by the patient.  Flank Pain Pertinent negatives include no chest pain, no abdominal pain, no headaches and no shortness of breath.      Past Medical History:  Diagnosis Date   CAD (coronary artery disease)    Chest pain    Hypertension    Indigestion    Obesity    ST elevation (STEMI) myocardial infarction (HCC)     There are no problems to display for this patient.   Past Surgical History:  Procedure Laterality Date   AORTIC ARCH ANGIOGRAPHY     CORONARY ANGIOPLASTY WITH STENT PLACEMENT       OB History   No obstetric history on file.     Family History  Problem Relation Age of Onset   Heart disease Sister     Social History   Tobacco Use   Smoking status: Some Days   Smokeless tobacco: Never   Tobacco comments:    2-3 daily  Substance Use  Topics   Alcohol use: Never   Drug use: Never    Home Medications Prior to Admission medications   Medication Sig Start Date End Date Taking? Authorizing Provider  aspirin EC 81 MG tablet Take 81 mg by mouth daily. Swallow whole.    [provider]  AVAPRO 150 MG tablet Take 1 tablet (150 mg total) by mouth daily. 12/01/20   Corky Crafts, MD  clonazePAM (KLONOPIN) 0.5 MG tablet Take by mouth daily.    [provider]  clopidogrel (PLAVIX) 75 MG tablet Take 1 tablet (75 mg total) by mouth daily. 12/01/20   Corky Crafts, MD  hydrochlorothiazide (MICROZIDE) 12.5 MG capsule Take 1 capsule (12.5 mg total) by mouth daily. 12/01/20   Corky Crafts, MD  metoprolol tartrate (LOPRESSOR) 25 MG tablet Take 25 mg by mouth 2 (two) times daily.    [provider]  rosuvastatin (CRESTOR) 40 MG tablet Take 1 tablet (40 mg total) by mouth daily. 12/01/20   Corky Crafts, MD    Allergies    Penicillins  Review of Systems   Review of Systems  Constitutional:  Negative for activity change, appetite change and fever.  HENT:  Negative for congestion.  Respiratory:  Negative for cough, chest tightness and shortness of breath.   Cardiovascular:  Negative for chest pain.  Gastrointestinal:  Negative for abdominal pain, nausea and vomiting.  Genitourinary:  Positive for flank pain. Negative for dysuria, hematuria and urgency.  Musculoskeletal:  Positive for back pain.  Skin:  Negative for rash.  Neurological:  Negative for dizziness, weakness and headaches.   all other systems are negative except as noted in the HPI and PMH.   Physical Exam Updated Vital Signs BP (!) 179/96 (BP Location: Right Arm)   Pulse 90   Temp 98.1 F (36.7 C) (Oral)   Resp 16   Ht 5\' 6"  (1.676 m)   Wt 89.8 kg   SpO2 96%   BMI 31.96 kg/m   Physical Exam Vitals and nursing note reviewed.  Constitutional:      General: She is not in acute distress.    Appearance: She is  well-developed.  HENT:     Head: Normocephalic and atraumatic.     Mouth/Throat:     Pharynx: No oropharyngeal exudate.  Eyes:     Conjunctiva/sclera: Conjunctivae normal.     Pupils: Pupils are equal, round, and reactive to light.  Neck:     Comments: No meningismus. Cardiovascular:     Rate and Rhythm: Normal rate and regular rhythm.     Heart sounds: Normal heart sounds. No murmur heard. Pulmonary:     Effort: Pulmonary effort is normal. No respiratory distress.     Breath sounds: Normal breath sounds.  Abdominal:     Palpations: Abdomen is soft.     Tenderness: There is no abdominal tenderness. There is no guarding or rebound.  Musculoskeletal:        General: Tenderness present. Normal range of motion.     Cervical back: Normal range of motion and neck supple.     Comments: L CVAT, no rash  5/5 strength in bilateral lower extremities. Ankle plantar and dorsiflexion intact. Great toe extension intact bilaterally. +2 DP and PT pulses. +2 patellar reflexes bilaterally. Normal gait.   Skin:    General: Skin is warm.  Neurological:     Mental Status: She is alert and oriented to person, place, and time.     Cranial Nerves: No cranial nerve deficit.     Motor: No abnormal muscle tone.     Coordination: Coordination normal.     Comments:  5/5 strength throughout. CN 2-12 intact.Equal grip strength.   Psychiatric:        Behavior: Behavior normal.    ED Results / Procedures / Treatments   Labs (all labs ordered are listed, but only abnormal results are displayed) Labs Reviewed  URINALYSIS, ROUTINE W REFLEX MICROSCOPIC - Abnormal; Notable for the following components:      Result Value   APPearance HAZY (*)    Hgb urine dipstick SMALL (*)    Protein, ur TRACE (*)    Bacteria, UA RARE (*)    All other components within normal limits  CBC WITH DIFFERENTIAL/PLATELET - Abnormal; Notable for the following components:   Platelets 147 (*)    All other components within normal  limits  COMPREHENSIVE METABOLIC PANEL - Abnormal; Notable for the following components:   Glucose, Bld 174 (*)    Calcium 10.8 (*)    ALT 56 (*)    All other components within normal limits  LIPASE, BLOOD    EKG None  Radiology CT RENAL STONE STUDY  Result Date: 02/21/2021 CLINICAL DATA:  Abdominal pain, aortic dissection suspected. Left flank pain EXAM: CT ABDOMEN AND PELVIS WITHOUT CONTRAST TECHNIQUE: Multidetector CT imaging of the abdomen and pelvis was performed following the standard protocol without IV contrast. COMPARISON:  None. FINDINGS: Lower chest: Calcified granuloma in the right lower lobe. Noncalcified nodule in the left lower lobe measures 7 mm on image 8. Scarring in the left lung base. Hepatobiliary: Several gallstones noted within the gallbladder. Diffuse low-density throughout the liver compatible with fatty infiltration. Scattered calcifications. Pancreas: No focal abnormality or ductal dilatation. Spleen: Scattered calcifications throughout the spleen. Normal size. Adrenals/Urinary Tract: No renal or ureteral stones. No hydronephrosis. Left renal parapelvic cysts. Adrenal glands and urinary bladder unremarkable. Stomach/Bowel: Colonic diverticulosis. No active diverticulitis. Normal appendix. Stomach and small bowel decompressed, unremarkable. Vascular/Lymphatic: Aortic atherosclerosis. No evidence of aneurysm or adenopathy. Reproductive: Uterus and adnexa unremarkable.  No mass. Other: No free fluid or free air. Musculoskeletal: No acute bony abnormality. IMPRESSION: No renal or ureteral stones. No hydronephrosis. Cholelithiasis. Hepatic steatosis. 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 Aortic atherosclerosis. 7 mm left lower lobe pulmonary nodule. Non-contrast chest CT at 6-12 months is recommended. If the nodule is stable at time of repeat CT, then future CT at 18-24 months (from today's scan) is considered optional for low-risk patients, but is recommended for  high-risk patients. This recommendation follows the consensus statement: Guidelines for Management of Incidental Pulmonary Nodules Detected on CT Images: From the Fleischner Society 2017; Radiology 2017; 284:228-243. Electronically Signed   By: Charlett Nose M.D.   On: 02/21/2021 02:15    Procedures Procedures   Medications Ordered in ED Medications  morphine 4 MG/ML injection 4 mg (has no administration in time range)  ondansetron (ZOFRAN) injection 4 mg (has no administration in time range)    ED Course  I have reviewed the triage vital signs and the nursing notes.  Pertinent labs & imaging results that were available during my care of the patient were reviewed by me and considered in my medical decision making (see chart for details).    MDM Rules/Calculators/A&P                           Left flank pain with no trauma.  Intact distal strength, sensation, pulses and reflexes.  Low suspicion for cord compression or cauda equina  UA with small hemoglobin.  No evidence of infection. Creatinine is normal.  Patient declined morphine and pain has improved with Toradol.  Consider possible kidney stone.  Consider evaluation for abdominal aortic aneurysm as well.  Discussed with patient that CT with IV contrast would be preferred.  Her IV is in her hand and she refused further attempts at a more proximal IV. Discussed that CT would give less information without contrast and vascular pathology such as aortic dissection may be missed which can be life threatening. She appears to have capacity to make this decision.   CT scan without contrast shows no aortic aneurysm.  No kidney stones or ureteral stones.  Left lower lobe pulmonary nodule discussed with patient.  Her pain has resolved after Toradol.  Discussed possibility of recently passed kidney stone versus musculoskeletal pain versus zoster without rash not yet visible.  She again declines CT scan with IV contrast.  She understands that  vascular pathology such as aortic dissection can be missed but this seems less likely at this time.  Pain has resolved.  Equal upper extremity blood pressures.  Recent age adjusted  D-dimer negative. Recent CT PE study 02/10/21 without acute vascular pathology  Treat supportively for suspected musculoskeletal pain versus passed kidney stone.  She remains pain-free.  No chest pain or shortness of breath.  No evidence of UTI. Return to the ED with worsening pain, unilateral weakness, numbness, tingling, fever, vomiting, difficulty with urination, chest pain, shortness of breath, or any other concerns. Final Clinical Impression(s) / ED Diagnoses Final diagnoses:  Flank pain  Acute left-sided low back pain without sciatica    Rx / DC Orders ED Discharge Orders     None        Jenita Rayfield, Jeannett Senior, MD 02/21/21 0410

## 2021-02-21 NOTE — ED Notes (Signed)
Upon attempting to insert a PIV for CTA, the patient stated she would like to await for her Blood Panel and UA Results prior to having CT completed. MD made aware.

## 2021-02-21 NOTE — ED Notes (Signed)
This RN presented the AVS utilizing Teachback Method. Patient verbalizes understanding of Discharge Instructions. Opportunity for Questioning and Answers were provided. Patient Discharged from ED ambulatory to home with Sister.

## 2021-02-21 NOTE — ED Triage Notes (Signed)
Patient here POV from Home with Left Flank Pain.  Patient states Pain began approximately at 2030 this PM. Pain is Mainly located in the Left Flank Area.  Mild to Moderate Nausea. No Fevers at Home. No CP. No SOB. No Urinary Symptoms. No Diarrhea. No Vomiting. No ABD Pain.

## 2021-02-21 NOTE — ED Notes (Signed)
Patient did not tolerate [place] IV well and requested for Process to stop. This RN stopepd and informed MD that PIV in the [facility] or above could not be obtained.

## 2021-02-26 ENCOUNTER — Ambulatory Visit (HOSPITAL_COMMUNITY): Payer: Medicare HMO

## 2021-02-28 ENCOUNTER — Ambulatory Visit (HOSPITAL_COMMUNITY): Payer: Medicare HMO

## 2021-02-28 NOTE — Progress Notes (Signed)
Cardiology Office Note   Date:  03/01/2021   ID:  Lacey Martin, DOB June 17, 1951, MRN [id number]  PCP:  Pcp, No    No chief complaint on file.  CAD  Wt Readings from Last 3 Encounters:  02/21/21 198 lb (89.8 kg)  02/10/21 197 lb (89.4 kg)  12/12/20 210 lb 15.7 oz (95.7 kg)       History of Present Illness: Lacey Martin is a 70 y.o. female  who is being seen today for the evaluation of CAD at the request of the patient.  Her sister is a patient here.    Review of prior records show that in November 2021, she presented to [facility] health in [facility] with burning indigestion.  ECG revealed inferior ST elevation MI.  She had 4.0 x 18 mm, 3.5 x 26 mm Resolute stents to the RCA. She underwent emergent catheterization and PTCA of the proximal and mid right coronary artery.  Echocardiogram done at that time showed LVEF 50 to 55%.  Normal mitral valve and aortic valve structure and function.  Normal aorta size.  No atrial level shunt.  Normal RV function.  Normal atrial sizes.  She was placed on Lipitor 80 mg daily.  She was discharged home the following day.  She was counseled to stop smoking.  At that time, LDL was 145.  Triglycerides 173.  Total cholesterol 223.  HDL 43.  Creatinine was 0.92.  She was sent home on atorvastatin 80 mg daily, aspirin 81 mg daily, metoprolol 25 mg twice daily, Brilinta 90 mg twice daily and losartan 50 mg daily.   She is here to have local follow-up in [place].  She is originally from [place], but moved to [place] in 3/22.   She retired from [employer] 4 years ago.    She is cut back on cigarette smoking.  Down to 2-3 cigs, from 1 ppd.   In May 2022, it was noted: "Since the MI, she has done well in general.  She never did cardiac rehab.  She does walk.  She is trying to lose weight.  She feels that her energy level is less.  She feels fatigued."  She had stopped most of her medications due to perceived side effects including her dual antiplatelet  therapy.  I explained to her the rationale behind her medications and the need to prevent stent thrombosis.  Several medications were added back in a stepwise fashion as follows: "She will add the medicines one by one, starting with clopidogrel, and Avapro, then HCTZ, then rosuvastatin.  Bmet, liver, lipids, TSH in a few weeks after she has been on the Avapro and HCTZ.  Normal renal function noted in [place]."  She had a foot injury and now has some right leg numbness.    Past Medical History:  Diagnosis Date   CAD (coronary artery disease)    Chest pain    Hypertension    Indigestion    Obesity    ST elevation (STEMI) myocardial infarction [facility])     Past Surgical History:  Procedure Laterality Date   AORTIC ARCH ANGIOGRAPHY     CORONARY ANGIOPLASTY WITH STENT PLACEMENT       Current Outpatient Medications  Medication Sig Dispense Refill   aspirin EC 81 MG tablet Take 81 mg by mouth daily. Swallow whole.     AVAPRO 150 MG tablet Take 1 tablet (150 mg total) by mouth daily. 90 tablet 3   hydrochlorothiazide (MICROZIDE) 12.5 MG capsule  Take 1 capsule (12.5 mg total) by mouth daily. 90 capsule 3   clonazePAM (KLONOPIN) 0.5 MG tablet Take by mouth daily. (Patient not taking: Reported on 03/01/2021)     clopidogrel (PLAVIX) 75 MG tablet Take 1 tablet (75 mg total) by mouth daily. (Patient not taking: Reported on 03/01/2021) 90 tablet 3   methocarbamol (ROBAXIN) 500 MG tablet Take 1 tablet (500 mg total) by mouth every 8 (eight) hours as needed for muscle spasms. (Patient not taking: Reported on 03/01/2021) 20 tablet 0   metoprolol tartrate (LOPRESSOR) 25 MG tablet Take 25 mg by mouth 2 (two) times daily. (Patient not taking: Reported on 03/01/2021)     rosuvastatin (CRESTOR) 40 MG tablet Take 1 tablet (40 mg total) by mouth daily. (Patient not taking: Reported on 03/01/2021) 90 tablet 3   No current facility-administered medications for this visit.    Allergies:   Penicillins    Social  History:  The patient  reports that she has been smoking. She has never used smokeless tobacco. She reports that she does not drink alcohol and does not use drugs.   Family History:  The patient's family history includes Heart disease in her sister.    ROS:  Please see the history of present illness.   Otherwise, review of systems are positive for difficulty stopping smoking, stopping medications on her own due to her internet research.   All other systems are reviewed and negative.    PHYSICAL EXAM: VS:  BP 140/90   Pulse 83   Ht 5\' 6"  (1.676 m)   SpO2 93%   BMI 31.96 kg/m  , BMI Body mass index is 31.96 kg/m. GEN: Well nourished, well developed, in no acute distress; smoker's voice HEENT: normal Neck: no JVD, carotid bruits, or masses Cardiac: RRR; no murmurs, rubs, or gallops,no edema  Respiratory:  clear to auscultation bilaterally, normal work of breathing GI: soft, nontender, nondistended, + BS MS: no deformity or atrophy, 2+ PT pulses bilaterally Skin: warm and dry, no rash Neuro:  Strength and sensation are intact Psych: euthymic mood, full affect   Recent Labs: 12/15/2020: TSH 5.650 02/21/2021: ALT 56; BUN 15; Creatinine, Ser 0.77; Hemoglobin 14.2; Platelets 147; Potassium 3.9; Sodium 138   Lipid Panel    Component Value Date/Time   CHOL 179 12/15/2020 0836   TRIG 154 (H) 12/15/2020 0836   HDL 51 12/15/2020 0836   CHOLHDL 3.5 12/15/2020 0836   LDLCALC 101 (H) 12/15/2020 0836     Other studies Reviewed: Additional studies/ records that were reviewed today with results demonstrating: LDL 101.   ASSESSMENT AND PLAN:  CAD/Old MI: She refuses to take clopidogrel and Brilinta.  I explained to her in the future that if she needs a stent, she would have to take antiplatelet therapy.  She does her 02/14/2021.  After further discussion, she explained that her sister had a severe bleeding problem while on dual antiplatelet therapy after stent placement.  The patient  is willing to take clopidogrel monotherapy.  We will stop her aspirin and start her on clopidogrel 75 mg daily.  She is agreeable to this in part because of the stomach irritation that she feels with aspirin, as well as that she read something that the risk of GI bleeding with Plavix may be less than aspirin. HTN: Willing to take Avapro and HCTZ.  Fairly well controlled. Hyperlipidemia: She does not want to take a statin.  She is trying to change her diet.  She has gotten  her blood sugar down. Tobacco abuse: She is trying to stop.  Leg pain: refer to neurology per her preference for right leg numbness and weakness.  Palpable pedal pulses bilaterally.  I do not think her symptoms are coming from PAD.   Current medicines are reviewed at length with the patient today.  The patient concerns regarding her medicines were addressed.  The following changes have been made:  No change  Labs/ tests ordered today include:  No orders of the defined types were placed in this encounter.   Recommend 150 minutes/week of aerobic exercise Low fat, low carb, high fiber diet recommended  Disposition:   FU in 6 months   Signed, Lance Muss, MD  03/01/2021 10:23 AM    [facility] [address] [place], [place], [place]  [address] Phone: [phone]; Fax: [phone]

## 2021-03-01 ENCOUNTER — Other Ambulatory Visit: Payer: Self-pay

## 2021-03-01 ENCOUNTER — Encounter: Payer: Self-pay | Admitting: Interventional Cardiology

## 2021-03-01 ENCOUNTER — Ambulatory Visit: Payer: Medicare HMO | Admitting: Interventional Cardiology

## 2021-03-01 VITALS — BP 140/90 | HR 83 | Ht 66.0 in

## 2021-03-01 DIAGNOSIS — E782 Mixed hyperlipidemia: Secondary | ICD-10-CM | POA: Diagnosis not present

## 2021-03-01 DIAGNOSIS — I10 Essential (primary) hypertension: Secondary | ICD-10-CM | POA: Diagnosis not present

## 2021-03-01 DIAGNOSIS — I25118 Atherosclerotic heart disease of native coronary artery with other forms of angina pectoris: Secondary | ICD-10-CM

## 2021-03-01 DIAGNOSIS — I252 Old myocardial infarction: Secondary | ICD-10-CM

## 2021-03-01 DIAGNOSIS — R29898 Other symptoms and signs involving the musculoskeletal system: Secondary | ICD-10-CM

## 2021-03-01 MED ORDER — CLOPIDOGREL BISULFATE 75 MG PO TABS: 75.0000 mg | ORAL_TABLET | Freq: Every day | ORAL | 3 refills | Status: AC

## 2021-03-01 NOTE — Patient Instructions (Signed)
Medication Instructions:  Your physician has recommended you make the following change in your medication: STOP: Aspirin START:  clopidogrel (Plavix) 75 mg by mouth daily *If you need a refill on your cardiac medications before your next appointment, please call your pharmacy*   Lab Work: NONE If you have labs (blood work) drawn today and your tests are completely normal, you will receive your results only by: MyChart Message (if you have MyChart) OR A paper copy in the mail If you have any lab test that is abnormal or we need to change your treatment, we will call you to review the results.   Testing/Procedures: Your physician has referred you to Neurology for right leg weakness.    Follow-Up: At [facility], you and your health needs are our priority.  As part of our continuing mission to provide you with exceptional heart care, we have created designated Provider Care Teams.  These Care Teams include your primary Cardiologist (physician) and Advanced Practice Providers (APPs -  Physician Assistants and Nurse Practitioners) who all work together to provide you with the care you need, when you need it.    Your next appointment:   6 month(s)  The format for your next appointment:   In Person  Provider:   You may see Lance Muss, MD or one of the following Advanced Practice Providers on your designated Care Team:   Ronie Spies, PA-C Jacolyn Reedy, PA-C

## 2021-03-02 ENCOUNTER — Ambulatory Visit (HOSPITAL_COMMUNITY): Payer: Medicare HMO

## 2021-03-05 ENCOUNTER — Ambulatory Visit (HOSPITAL_COMMUNITY): Payer: Medicare HMO

## 2021-03-07 ENCOUNTER — Ambulatory Visit (HOSPITAL_COMMUNITY): Payer: Medicare HMO

## 2021-03-09 ENCOUNTER — Ambulatory Visit (HOSPITAL_COMMUNITY): Payer: Medicare HMO

## 2021-03-14 ENCOUNTER — Ambulatory Visit (HOSPITAL_COMMUNITY): Payer: Medicare HMO

## 2021-03-16 ENCOUNTER — Ambulatory Visit (HOSPITAL_COMMUNITY): Payer: Medicare HMO

## 2021-03-19 ENCOUNTER — Ambulatory Visit (HOSPITAL_COMMUNITY): Payer: Medicare HMO

## 2021-03-21 ENCOUNTER — Ambulatory Visit (HOSPITAL_COMMUNITY): Payer: Medicare HMO

## 2021-03-23 ENCOUNTER — Ambulatory Visit (HOSPITAL_COMMUNITY): Payer: Medicare HMO

## 2021-03-26 ENCOUNTER — Ambulatory Visit (HOSPITAL_COMMUNITY): Payer: Medicare HMO

## 2021-03-28 ENCOUNTER — Ambulatory Visit (HOSPITAL_COMMUNITY): Payer: Medicare HMO

## 2021-03-30 ENCOUNTER — Ambulatory Visit (HOSPITAL_COMMUNITY): Payer: Medicare HMO

## 2021-04-02 ENCOUNTER — Ambulatory Visit (HOSPITAL_COMMUNITY): Payer: Medicare HMO

## 2021-04-04 ENCOUNTER — Ambulatory Visit (HOSPITAL_COMMUNITY): Payer: Medicare HMO

## 2021-04-06 ENCOUNTER — Ambulatory Visit (HOSPITAL_COMMUNITY): Payer: Medicare HMO

## 2021-04-09 ENCOUNTER — Ambulatory Visit (HOSPITAL_COMMUNITY): Payer: Medicare HMO

## 2021-04-11 ENCOUNTER — Ambulatory Visit (HOSPITAL_COMMUNITY): Payer: Medicare HMO

## 2021-04-13 ENCOUNTER — Ambulatory Visit (HOSPITAL_COMMUNITY): Payer: Medicare HMO

## 2021-04-16 ENCOUNTER — Ambulatory Visit (HOSPITAL_COMMUNITY): Payer: Medicare HMO

## 2021-04-18 ENCOUNTER — Ambulatory Visit (HOSPITAL_COMMUNITY): Payer: Medicare HMO

## 2021-04-20 ENCOUNTER — Ambulatory Visit (HOSPITAL_COMMUNITY): Payer: Medicare HMO

## 2022-02-11 ENCOUNTER — Other Ambulatory Visit: Payer: Self-pay | Admitting: Interventional Cardiology

## 2022-06-07 IMAGING — CT CT ANGIO CHEST
2 of 6 series · 17 of 46 positions shown · IV contrast (omnipaque)
Comparison: Chest radiograph performed today

CLINICAL DATA: 69-year-old female with LEFT chest pain and positive
D-dimer.

EXAM:
CT ANGIOGRAPHY CHEST WITH CONTRAST
TECHNIQUE: Multidetector CT imaging of the chest was performed using the
standard protocol during bolus administration of intravenous
contrast. Multiplanar CT image reconstructions and MIPs were
obtained to evaluate the vascular anatomy.
CONTRAST:  100mL OMNIPAQUE IOHEXOL 350 MG/ML SOLN

[Series 5: arterial · axial · arterial · 0.75mm/px · z∈[+1098,+1354]mm · 14 of 148 slices shown]
[im 10/148  lung]
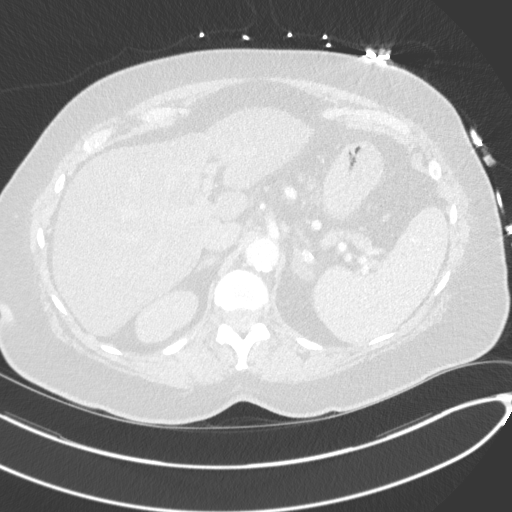
[im 20/148  soft-tissue]
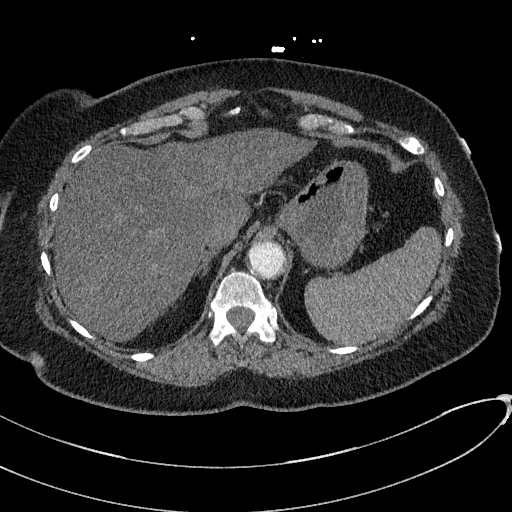
[im 30/148  lung]
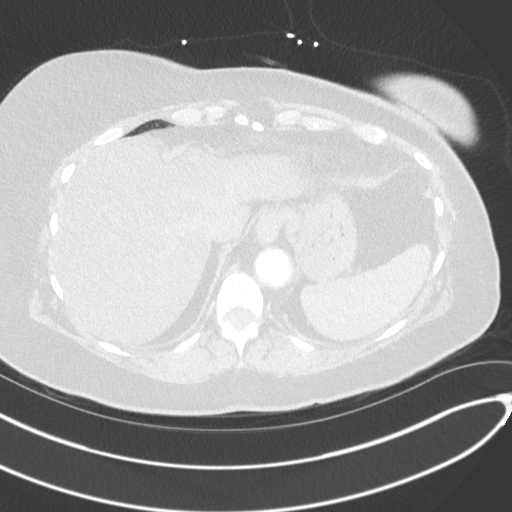
[im 40/148  soft-tissue]
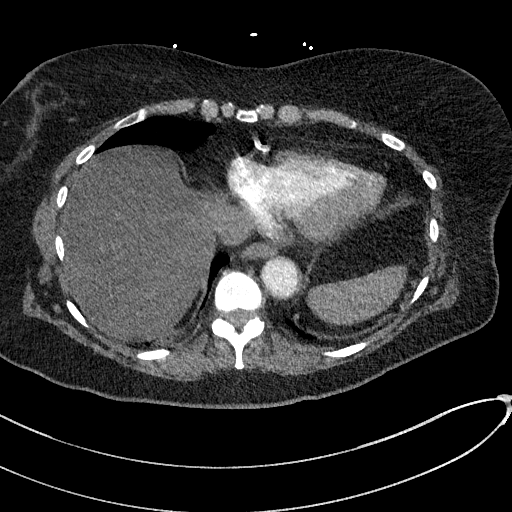
[im 50/148  lung]
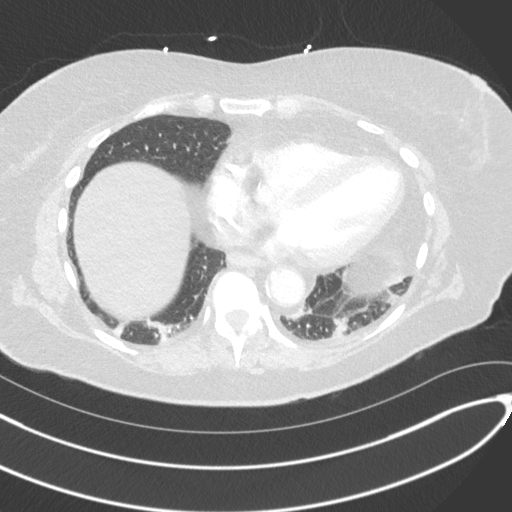
[im 59/148  soft-tissue]
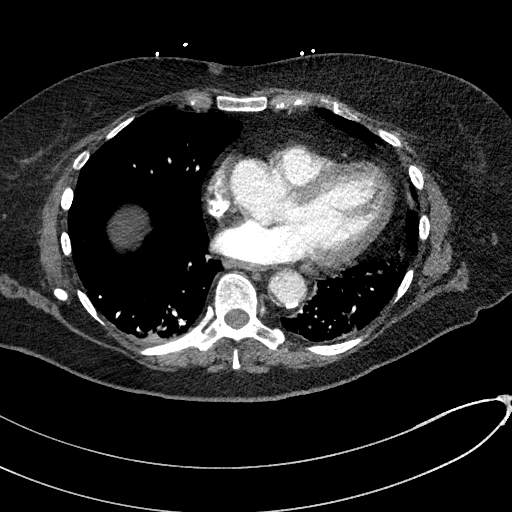
[im 69/148  lung]
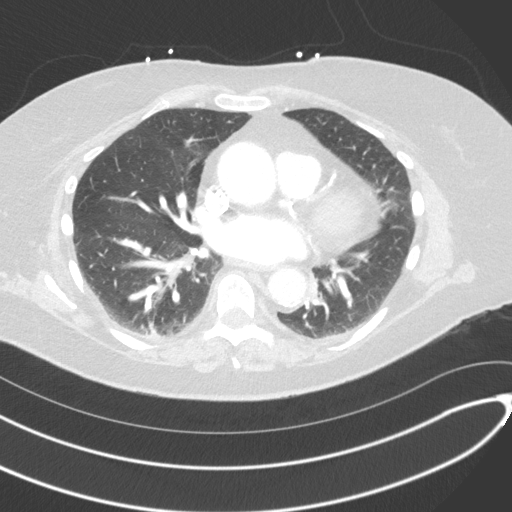
[im 79/148  soft-tissue]
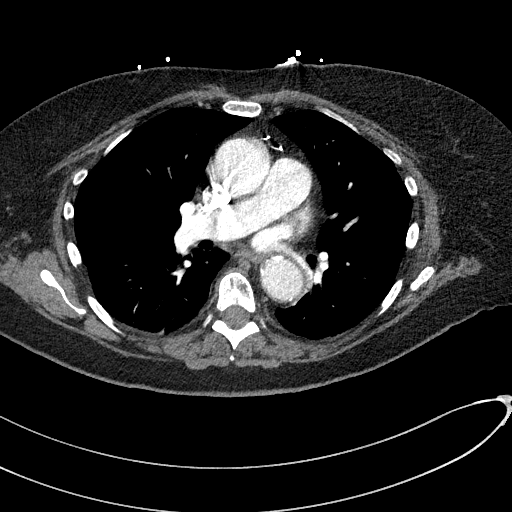
[im 89/148  lung]
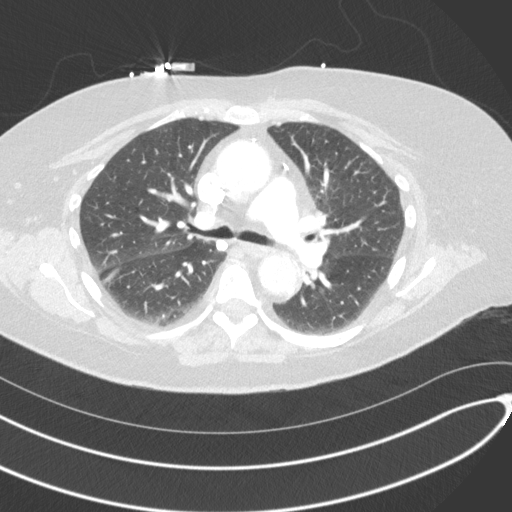
[im 99/148  soft-tissue]
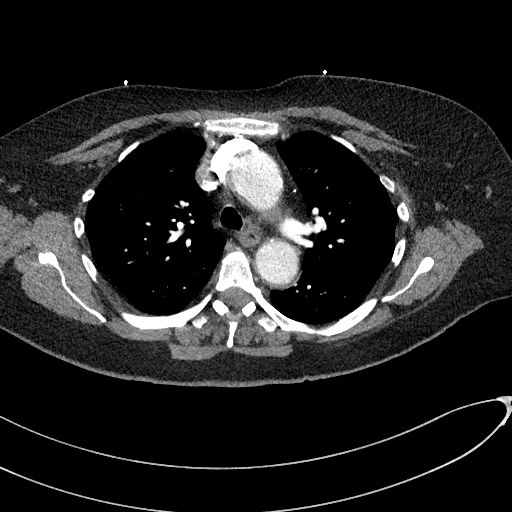
[im 108/148  lung]
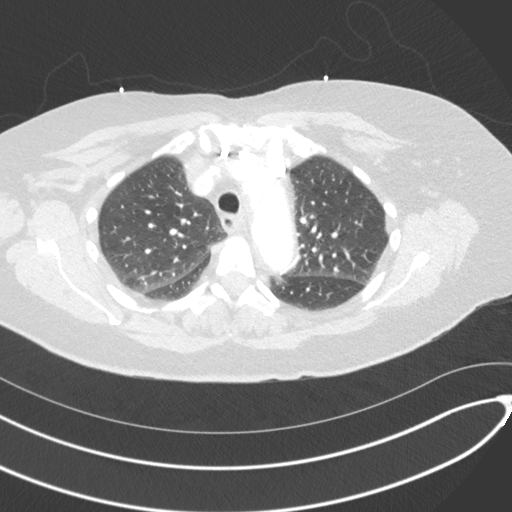
[im 118/148  soft-tissue]
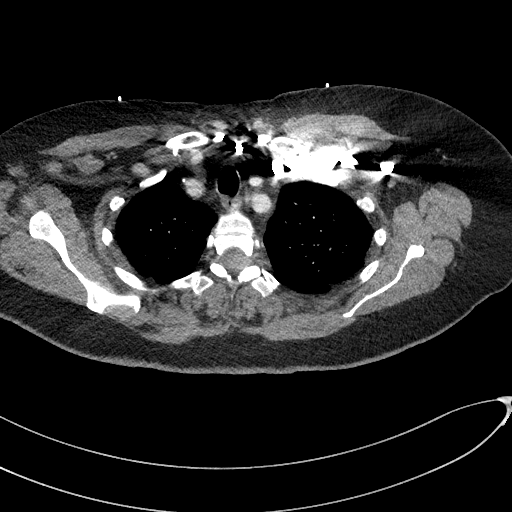
[im 128/148  lung]
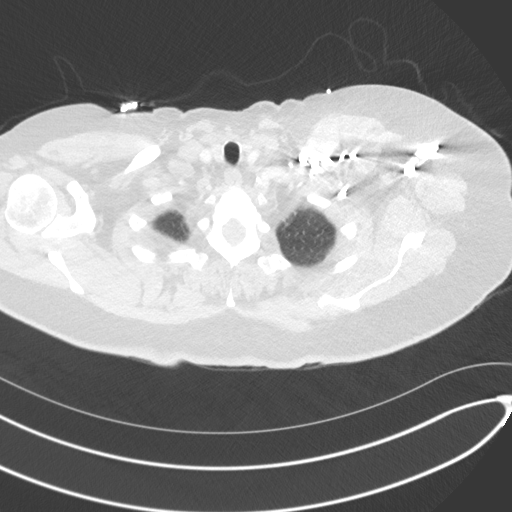
[im 138/148  soft-tissue]
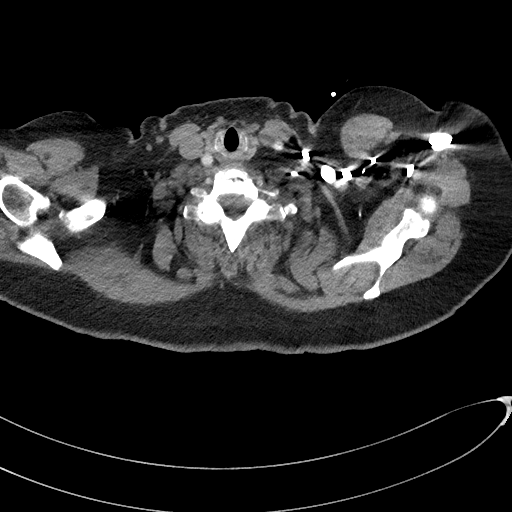

[Series 8: cor soft · coronal · 0.62mm/px · 3 of 131 slices shown]
[im 33/131  soft-tissue]
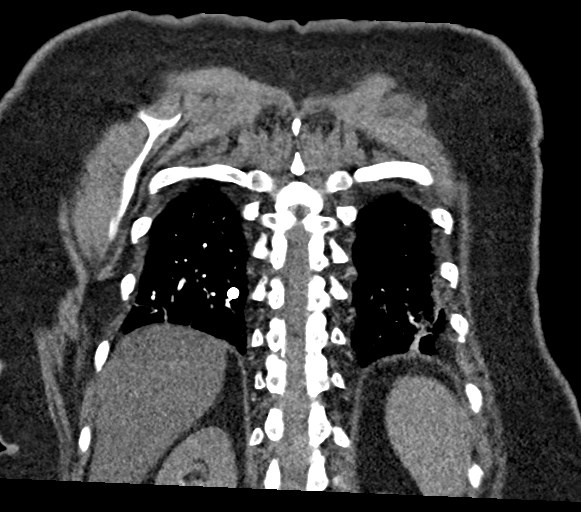
[im 66/131  soft-tissue]
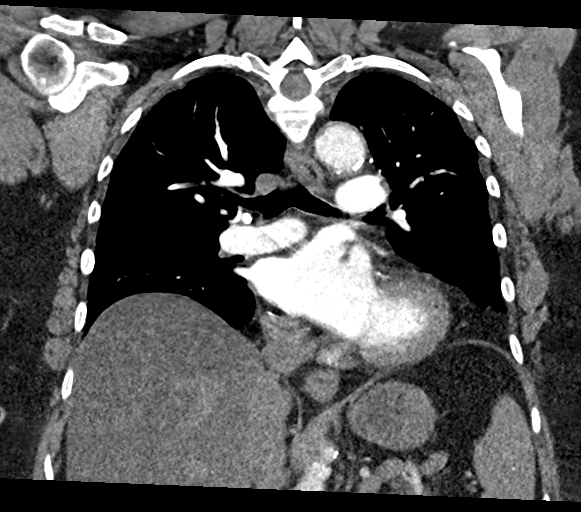
[im 98/131  soft-tissue]
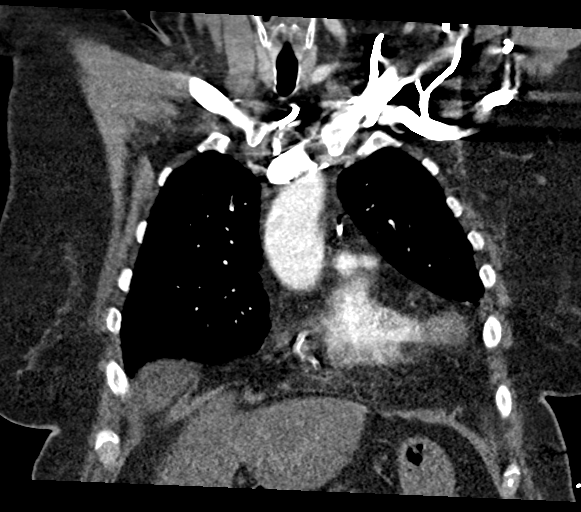

[17 of 46 positions shown; findings below may reference images not displayed]

FINDINGS: Cardiovascular: This is a technically adequate study for evaluation
of pulmonary emboli. No pulmonary emboli are identified.

Ectasias of the thoracic aorta noted with ascending aorta measuring
up to 3.8 cm. Coronary artery and aortic atherosclerotic
calcifications are present. No pericardial effusion identified.

Mediastinum/Nodes: No enlarged mediastinal, hilar, or axillary lymph
nodes. Calcified lymph nodes are present, compatible with prior
granulomatous disease. Thyroid gland, trachea, and esophagus
demonstrate no significant findings.

Lungs/Pleura: Mild to moderate bibasilar atelectasis is noted.
Calcified granuloma at the RIGHT lung base corresponds to the
nodular opacity identified on today's chest radiograph. No pulmonary
mass or suspicious nodule identified.

No airspace disease, pleural effusion or pneumothorax noted.

Upper Abdomen: Hepatic steatosis identified. Equivocal nodularity of
the liver is identified with UPPER limits normal spleen size. No
acute abnormalities are present.

Musculoskeletal: No acute or suspicious bony abnormalities noted.

Review of the MIP images confirms the above findings.
IMPRESSION: 1. No evidence of pulmonary emboli.
2. Mild to moderate bibasilar atelectasis. Nodule at the RIGHT lung
base on recent chest radiograph represents a benign calcified
granuloma.
3. Hepatic steatosis. Equivocal nodularity of the liver and UPPER
limits normal spleen size which may suggest cirrhosis.
4. Coronary artery disease.
5. Aortic Atherosclerosis ([id number]).

## 2022-06-07 IMAGING — DX DG CHEST 2V
2 series · 2 of 2 positions shown · non-contrast
Comparison: None.

CLINICAL DATA: Chest pain.

EXAM:
CHEST - 2 VIEW

[chest pa]
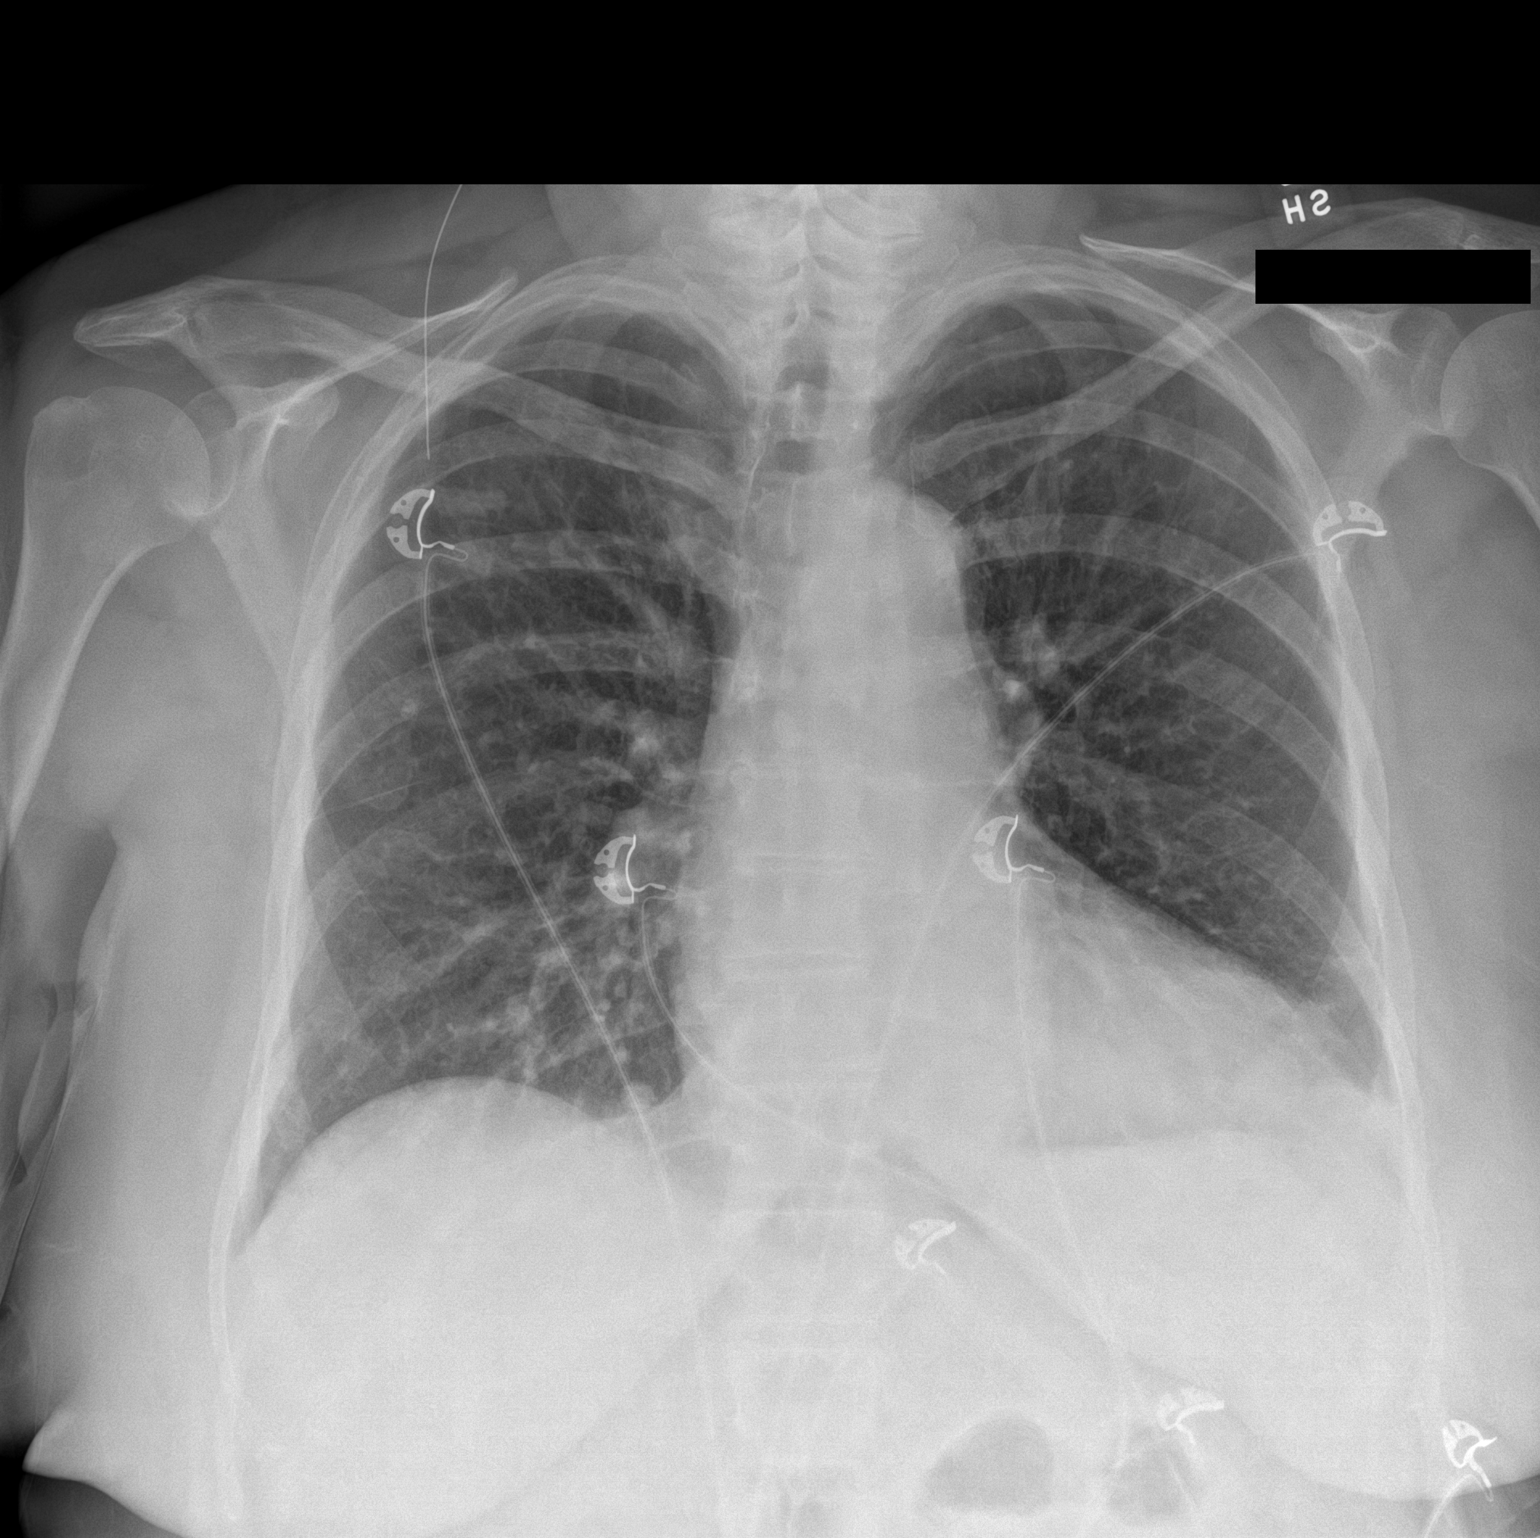

[chest lat]
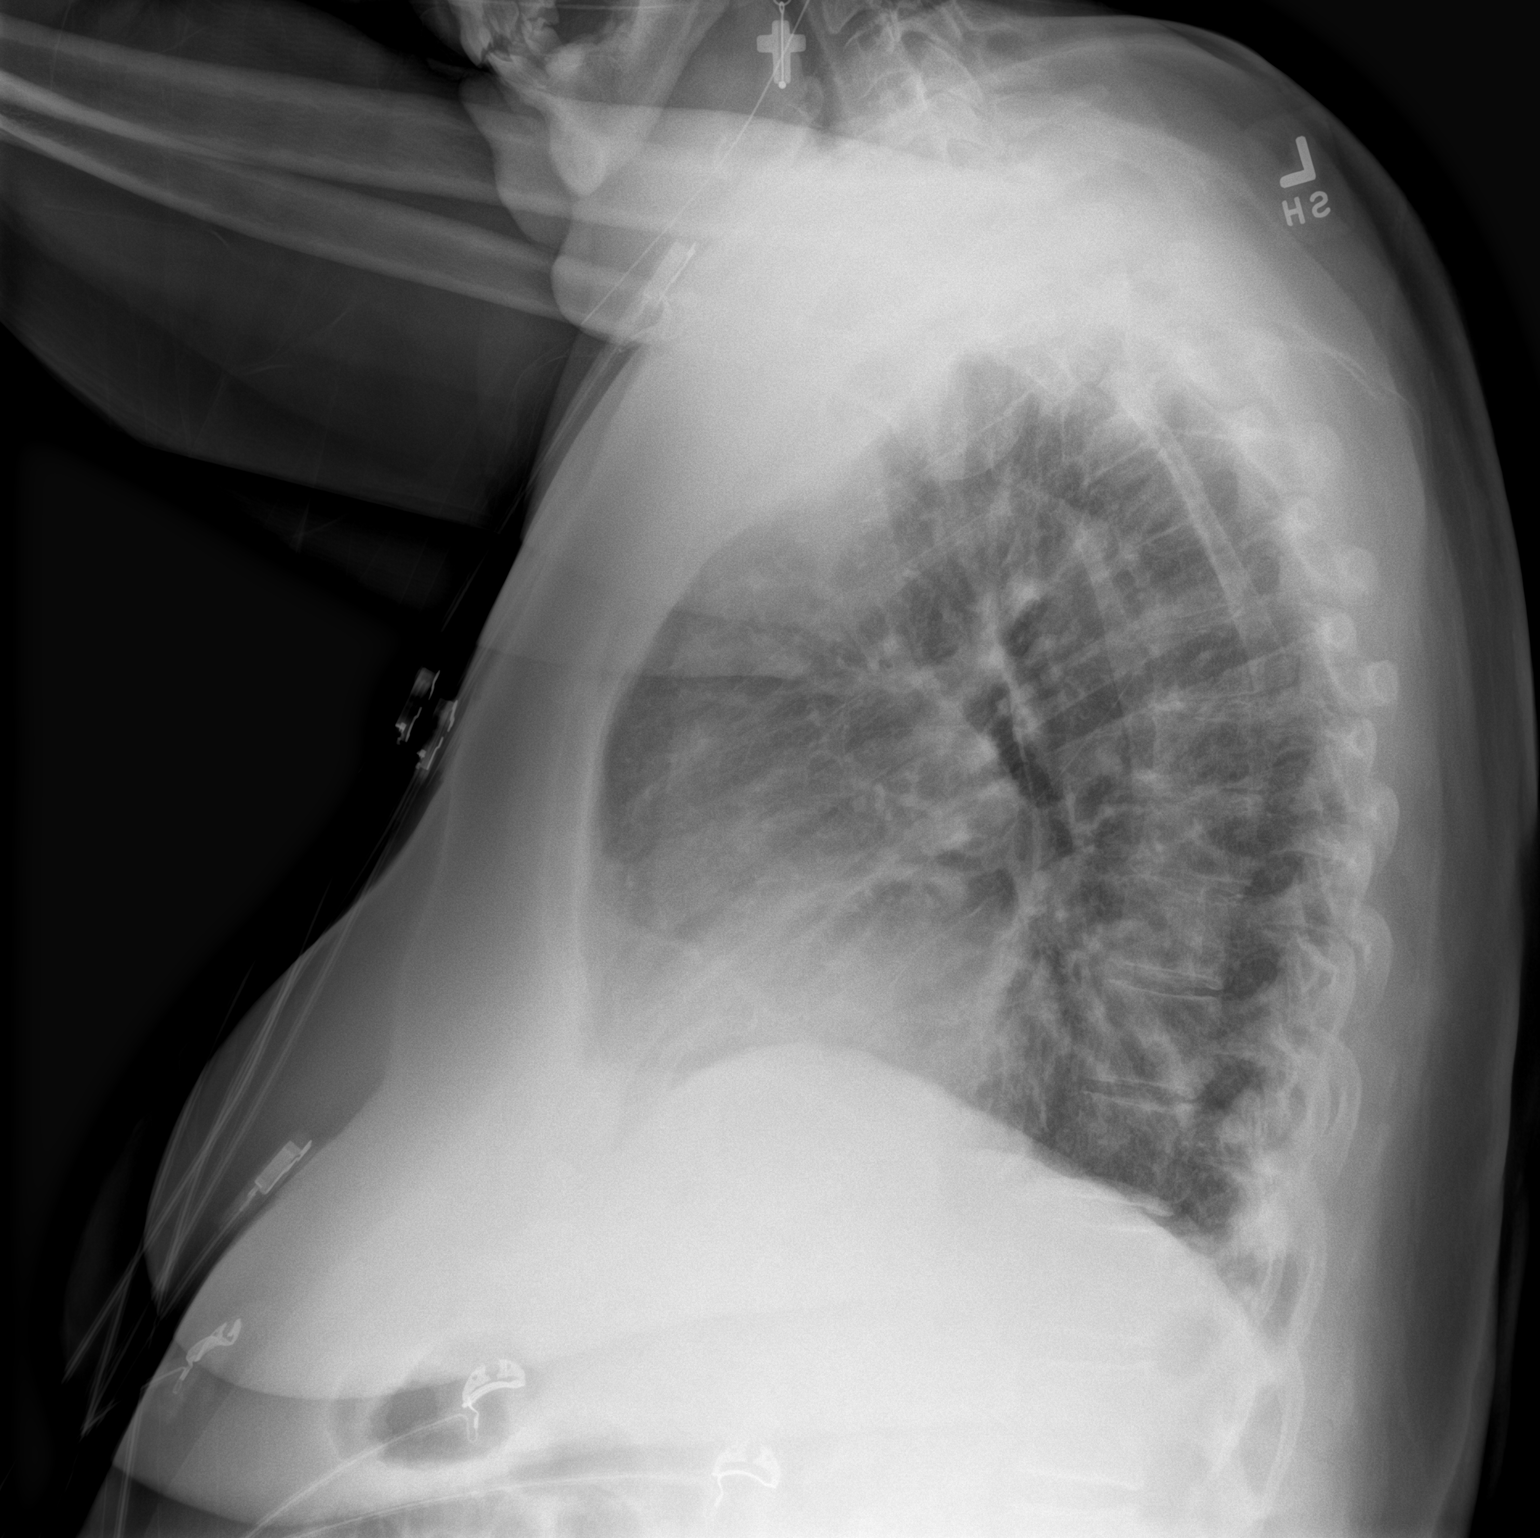

[2 of 2 positions shown; findings below may reference images not displayed]

FINDINGS: Few patchy densities near the left costophrenic angle. 10 mm nodular
density at the medial right lung base. This is larger than the
adjacent vascular structures and suspect this is a pulmonary nodule.
There may be an additional 5 mm nodule in the right mid lung which
could be calcified. Heart size is upper limits of normal. Negative
for a pneumothorax. Trachea is midline. No large pleural effusions.
IMPRESSION: 1. Subtle densities at the left costophrenic angle. Differential
diagnosis includes small focus of infection versus atelectasis.
2. Possible 1 cm nodule near the medial right lung base. Recommend
further characterization with a chest CT.

## 2022-06-18 ENCOUNTER — Other Ambulatory Visit: Payer: Self-pay | Admitting: Interventional Cardiology

## 2022-06-18 IMAGING — CT CT RENAL STONE PROTOCOL
2 of 4 series · 17 of 46 positions shown, 19 images · non-contrast
Comparison: None.

CLINICAL DATA: Abdominal pain, aortic dissection suspected. Left
flank pain

EXAM:
CT ABDOMEN AND PELVIS WITHOUT CONTRAST
TECHNIQUE: Multidetector CT imaging of the abdomen and pelvis was performed
following the standard protocol without IV contrast.

[Series 2: stone full · axial · 0.88mm/px · z∈[+781,+1211]mm · 14 of 94 slices shown, 16 images]
[im 4/94  soft-tissue]
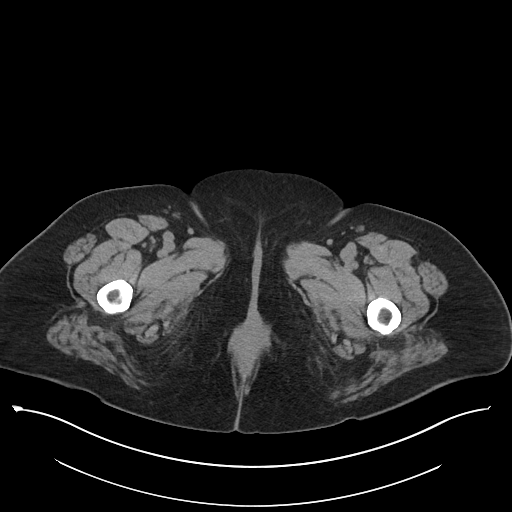
[im 4/94  bone]
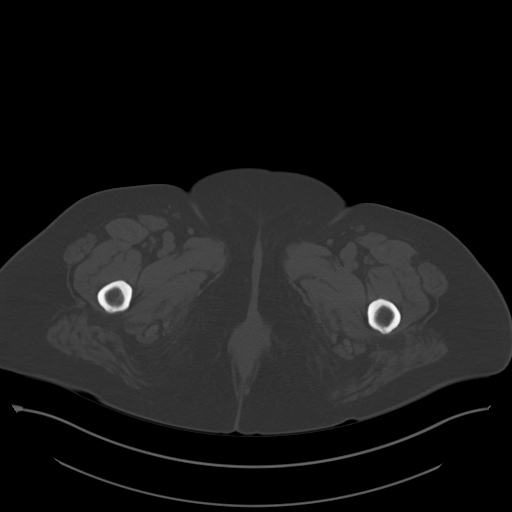
[im 12/94  soft-tissue]
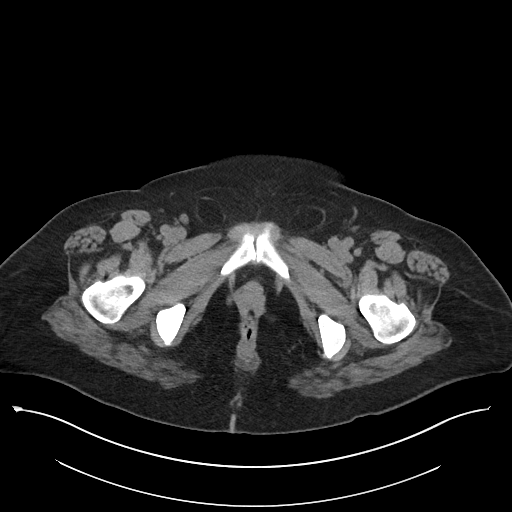
[im 20/94  soft-tissue]
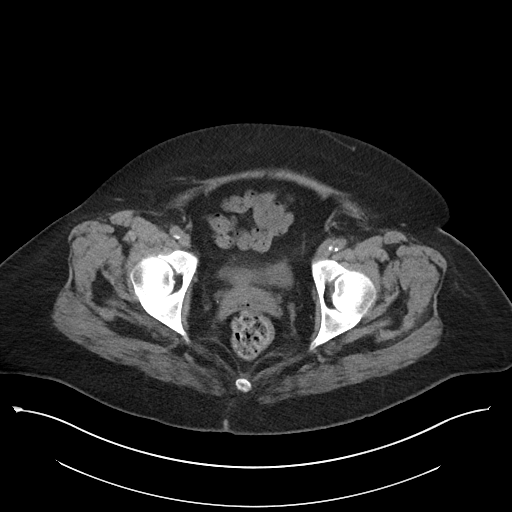
[im 24/94  soft-tissue]
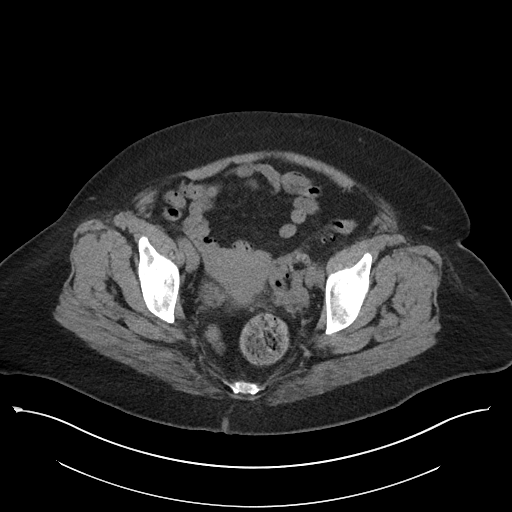
[im 32/94  soft-tissue]
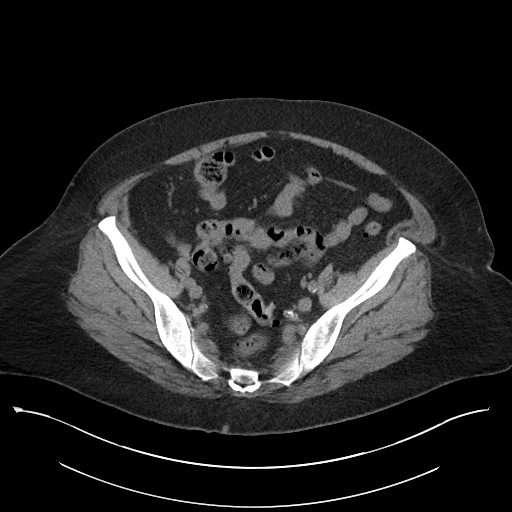
[im 39/94  soft-tissue]
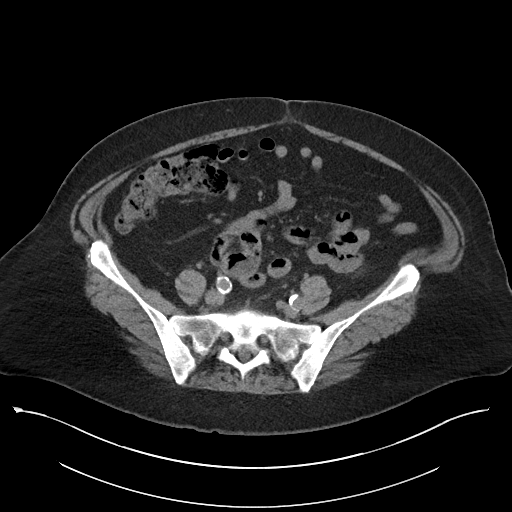
[im 43/94  soft-tissue]
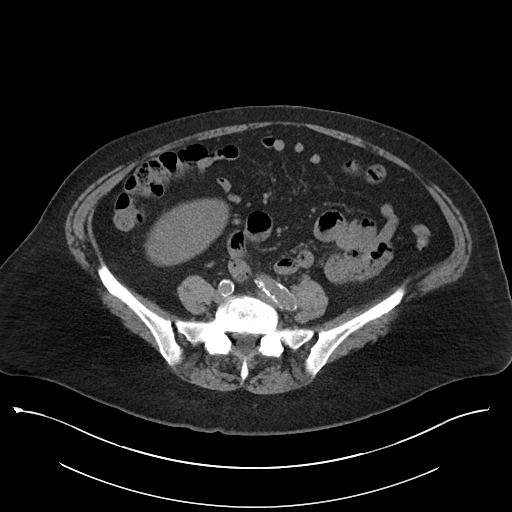
[im 51/94  soft-tissue]
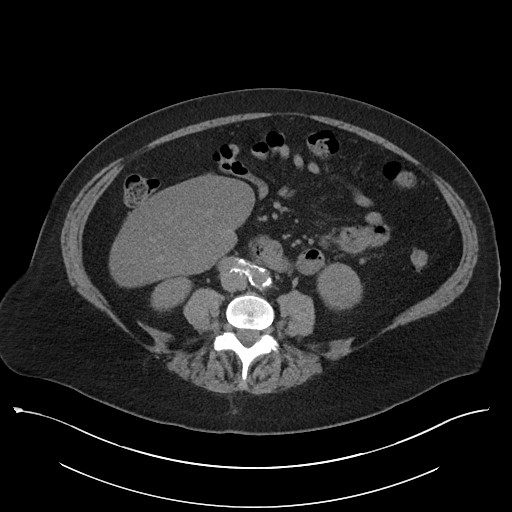
[im 55/94  soft-tissue]
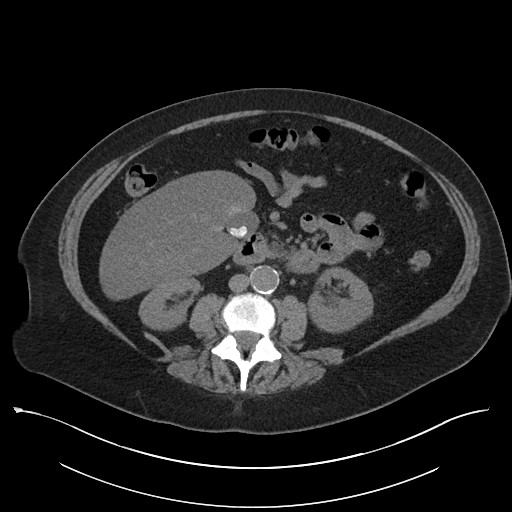
[im 55/94  bone]
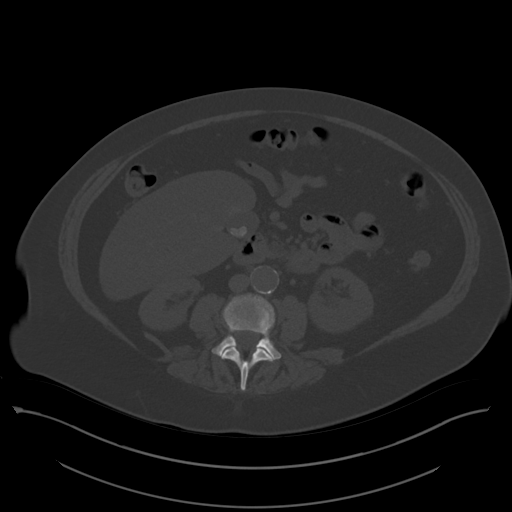
[im 63/94  soft-tissue]
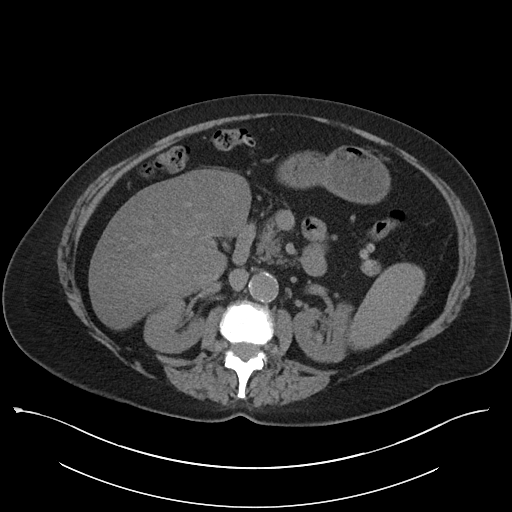
[im 70/94  soft-tissue]
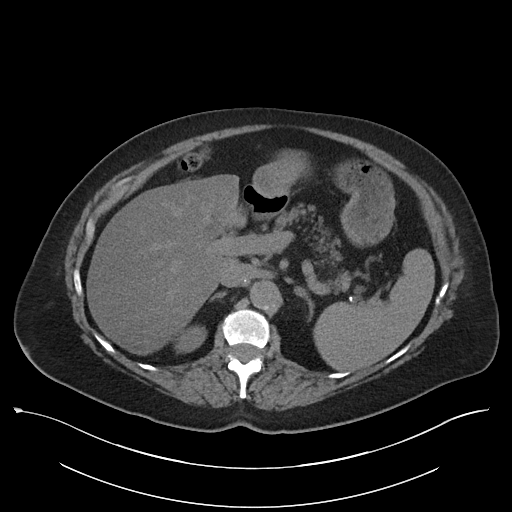
[im 74/94  soft-tissue]
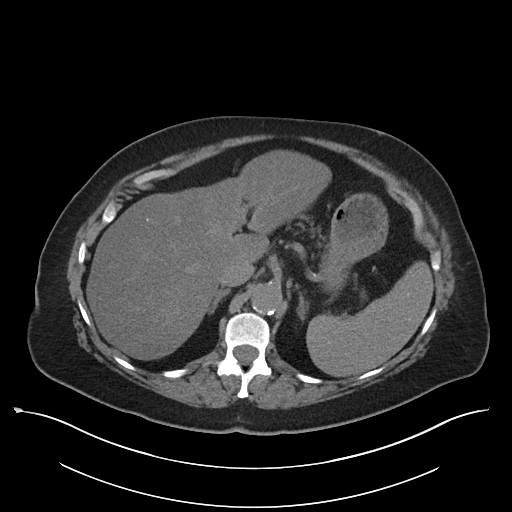
[im 82/94  soft-tissue]
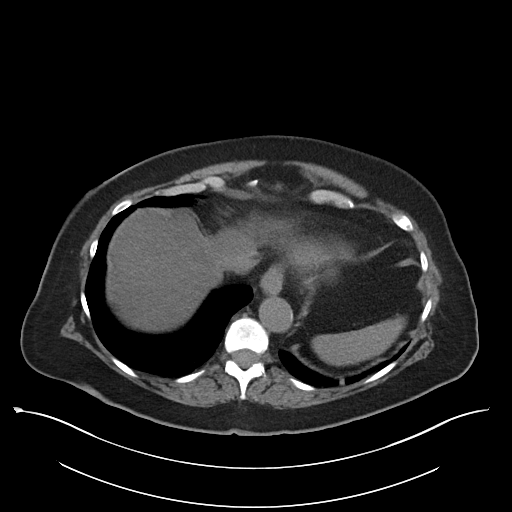
[im 90/94  soft-tissue]
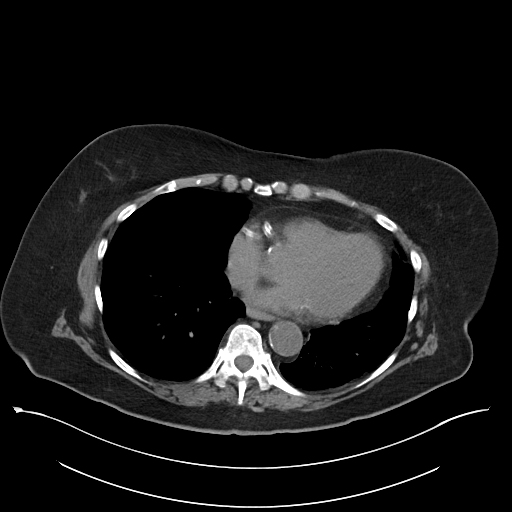

[Series 5: coronal · coronal · 0.88mm/px · 3 of 104 slices shown]
[im 35/104  soft-tissue]
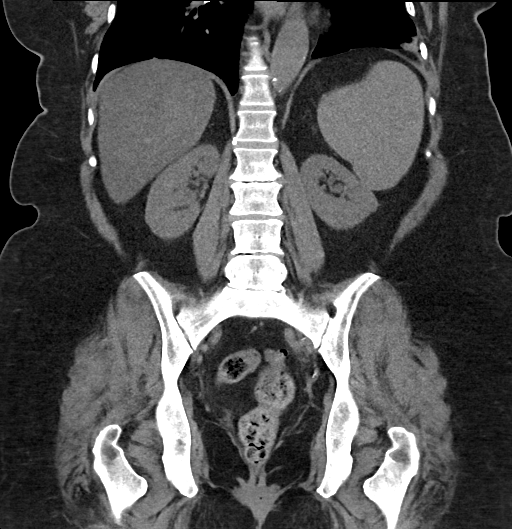
[im 46/104  soft-tissue]
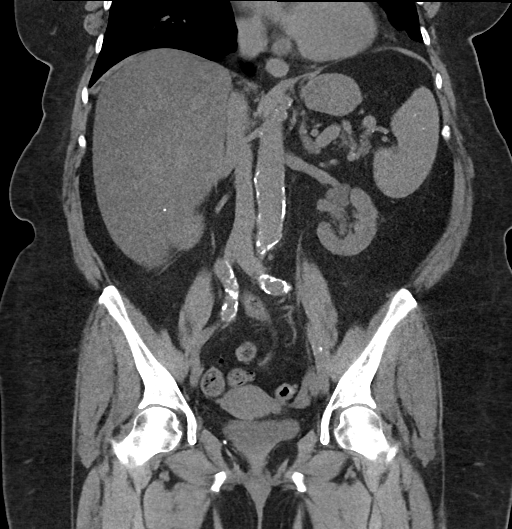
[im 58/104  soft-tissue]
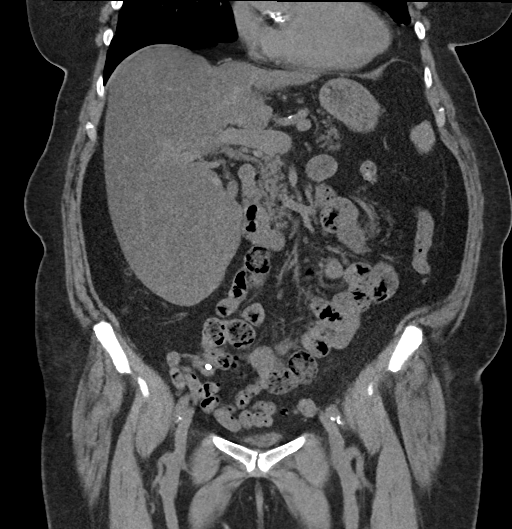

[17 of 46 positions shown; findings below may reference images not displayed]

FINDINGS: Lower chest: Calcified granuloma in the right lower lobe.
Noncalcified nodule in the left lower lobe measures 7 mm on image 8.
Scarring in the left lung base.

Hepatobiliary: Several gallstones noted within the gallbladder.
Diffuse low-density throughout the liver compatible with fatty
infiltration. Scattered calcifications.

Pancreas: No focal abnormality or ductal dilatation.

Spleen: Scattered calcifications throughout the spleen. Normal size.

Adrenals/Urinary Tract: No renal or ureteral stones. No
hydronephrosis. Left renal parapelvic cysts. Adrenal glands and
urinary bladder unremarkable.

Stomach/Bowel: Colonic diverticulosis. No active diverticulitis.
Normal appendix. Stomach and small bowel decompressed, unremarkable.

Vascular/Lymphatic: Aortic atherosclerosis. No evidence of aneurysm
or adenopathy.

Reproductive: Uterus and adnexa unremarkable.  No mass.

Other: No free fluid or free air.

Musculoskeletal: No acute bony abnormality.
IMPRESSION: No renal or ureteral stones. No hydronephrosis.

Cholelithiasis.

Hepatic steatosis.

Old granulomatous disease in the liver and spleen.

Aortic atherosclerosis.

7 mm left lower lobe pulmonary nodule. Non-contrast chest CT at 6-12
months is recommended. If the nodule is stable at time of repeat CT,
then future CT at 18-24 months (from today's scan) is considered
optional for low-risk patients, but is recommended for high-risk
patients. This recommendation follows the consensus statement:
Guidelines for Management of Incidental Pulmonary Nodules Detected

## 2022-06-18 MED ORDER — AVAPRO 150 MG PO TABS: 150.0000 mg | ORAL_TABLET | Freq: Every day | ORAL | 0 refills | Status: AC

## 2022-07-10 ENCOUNTER — Other Ambulatory Visit: Payer: Self-pay | Admitting: Interventional Cardiology

## 2023-04-18 ENCOUNTER — Other Ambulatory Visit: Payer: Self-pay | Admitting: Family Medicine

## 2023-04-18 DIAGNOSIS — R1013 Epigastric pain: Secondary | ICD-10-CM

## 2023-05-20 ENCOUNTER — Emergency Department (HOSPITAL_COMMUNITY)
Admission: EM | Admit: 2023-05-20 | Discharge: 2023-05-21 | Disposition: A | Discharge status: Home / Self Care | Payer: Medicare HMO | Attending: Emergency Medicine | Admitting: Emergency Medicine

## 2023-05-20 ENCOUNTER — Emergency Department (HOSPITAL_COMMUNITY): Payer: Medicare HMO

## 2023-05-20 ENCOUNTER — Other Ambulatory Visit: Payer: Self-pay

## 2023-05-20 DIAGNOSIS — Z79899 Other long term (current) drug therapy: Secondary | ICD-10-CM | POA: Diagnosis not present

## 2023-05-20 DIAGNOSIS — R109 Unspecified abdominal pain: Secondary | ICD-10-CM | POA: Diagnosis present

## 2023-05-20 DIAGNOSIS — Z7901 Long term (current) use of anticoagulants: Secondary | ICD-10-CM | POA: Diagnosis not present

## 2023-05-20 DIAGNOSIS — K5900 Constipation, unspecified: Secondary | ICD-10-CM | POA: Diagnosis not present

## 2023-05-20 DIAGNOSIS — I10 Essential (primary) hypertension: Secondary | ICD-10-CM | POA: Diagnosis not present

## 2023-05-20 DIAGNOSIS — I2510 Atherosclerotic heart disease of native coronary artery without angina pectoris: Secondary | ICD-10-CM | POA: Diagnosis not present

## 2023-05-20 LAB — COMPREHENSIVE METABOLIC PANEL
ALT: 16 U/L (ref 0–44)
AST: 17 U/L (ref 15–41)
Albumin: 3.8 g/dL (ref 3.5–5.0)
Alkaline Phosphatase: 76 U/L (ref 38–126)
Anion gap: 12 (ref 5–15)
BUN: 10 mg/dL (ref 8–23)
CO2: 23 mmol/L (ref 22–32)
Calcium: 10.3 mg/dL (ref 8.9–10.3)
Chloride: 101 mmol/L (ref 98–111)
Creatinine, Ser: 0.82 mg/dL (ref 0.44–1.00)
GFR, Estimated: >60 mL/min (ref >60)
Glucose, Bld: 268 mg/dL — ABNORMAL HIGH (ref 70–99)
Potassium: 3.9 mmol/L (ref 3.5–5.1)
Sodium: 136 mmol/L (ref 135–145)
Total Bilirubin: 0.7 mg/dL (ref <1.2)
Total Protein: 6.8 g/dL (ref 6.5–8.1)

## 2023-05-20 LAB — URINALYSIS, ROUTINE W REFLEX MICROSCOPIC
Bilirubin Urine: NEGATIVE
Glucose, UA: 50 mg/dL — AB
Hgb urine dipstick: NEGATIVE
Ketones, ur: 5 mg/dL — AB
Leukocytes,Ua: NEGATIVE
Nitrite: NEGATIVE
Protein, ur: 30 mg/dL — AB
Specific Gravity, Urine: 1.02 (ref 1.005–1.030)
pH: 6 (ref 5.0–8.0)

## 2023-05-20 LAB — CBC
HCT: 48.8 % — ABNORMAL HIGH (ref 36.0–46.0)
Hemoglobin: 15.8 g/dL — ABNORMAL HIGH (ref 12.0–15.0)
MCH: 26.8 pg (ref 26.0–34.0)
MCHC: 32.4 g/dL (ref 30.0–36.0)
MCV: 82.9 fL (ref 80.0–100.0)
Platelets: 241 10*3/uL (ref 150–400)
RBC: 5.89 MIL/uL — ABNORMAL HIGH (ref 3.87–5.11)
RDW: 13.2 % (ref 11.5–15.5)
WBC: 10.4 10*3/uL (ref 4.0–10.5)
nRBC: 0 % (ref 0.0–0.2)

## 2023-05-20 LAB — LIPASE, BLOOD: Lipase: 25 U/L (ref 11–51)

## 2023-05-20 MED ORDER — FLEET ENEMA RE ENEM: 1.0000 | ENEMA | Freq: Once | RECTAL | 0 refills | Status: AC

## 2023-05-20 MED ORDER — ONDANSETRON HCL 4 MG/2ML IJ SOLN
4.0000 mg | Freq: Once | INTRAMUSCULAR | Status: AC
  Administered 2023-05-20: 4 mg via INTRAVENOUS

## 2023-05-20 MED ORDER — IOPAMIDOL (ISOVUE-370) INJECTION 76%
75.0000 mL | Freq: Once | INTRAVENOUS | Status: AC | PRN
  Administered 2023-05-20: 75 mL via INTRAVENOUS

## 2023-05-20 MED ORDER — SODIUM CHLORIDE 0.9 % IV BOLUS
1000.0000 mL | Freq: Once | INTRAVENOUS | Status: AC
  Administered 2023-05-20: 1000 mL via INTRAVENOUS

## 2023-05-20 MED ORDER — MAGNESIUM CITRATE PO SOLN
1.0000 | Freq: Once | ORAL | Status: AC
  Administered 2023-05-20: 1 via ORAL
  Filled 2023-05-20: qty 296

## 2023-05-20 MED ORDER — POLYETHYLENE GLYCOL 3350 17 GM/SCOOP PO POWD
1.0000 | Freq: Once | ORAL | 0 refills | Status: AC
Start: 2023-05-20 — End: 2023-05-20

## 2023-05-20 MED ORDER — MORPHINE SULFATE (PF) 4 MG/ML IV SOLN
4.0000 mg | Freq: Once | INTRAVENOUS | Status: AC
  Administered 2023-05-20: 4 mg via INTRAVENOUS
  Filled 2023-05-20: qty 1

## 2023-05-20 MED ORDER — ONDANSETRON HCL 4 MG/2ML IJ SOLN
INTRAMUSCULAR | Status: AC
  Filled 2023-05-20: qty 2

## 2023-05-20 MED ORDER — FLEET ENEMA RE ENEM
1.0000 | ENEMA | Freq: Once | RECTAL | Status: AC
  Administered 2023-05-20: 1 via RECTAL
  Filled 2023-05-20: qty 1

## 2023-05-20 NOTE — Discharge Instructions (Signed)
You were seen in the emerged department for constipation and abdominal pain The CAT scan looked okay and your blood work looked okay as well Constipation is most likely in the setting of tramadol use Please limit your use of this medication if your pain is well-controlled Pick up the MiraLAX and take as directed to achieve soft bowel movements You could also try a Fleet enema if the MiraLAX does not work Return to the emerged part for severe pain or if you are unable to have bowel movements at home Otherwise please follow-up with your primary care doctor in 1 week for reevaluation

## 2023-05-20 NOTE — ED Provider Notes (Signed)
EMERGENCY DEPARTMENT AT [facility] Provider Note   CSN: [id number] Arrival date & time: 05/20/23  1531     History  Chief Complaint  Patient presents with   Constipation   Post-op Problem    Lacey Martin is a 72 y.o. female.  With a past medical history status post cholecystectomy, CAD, hypertension and MI who presents to the ED for abdominal pain.  She underwent laparoscopic cholecystectomy at [facility] on November 2.  Postoperative pain management with tramadol.  Significant constipation over the last week.  She is passing gas but no significant bowel movements.  Associated nausea and vomiting with abdominal discomfort.  No fevers or chills.   Constipation      Home Medications Prior to Admission medications   Medication Sig Start Date End Date Taking? Authorizing Provider  polyethylene glycol powder (GLYCOLAX/MIRALAX) 17 GM/SCOOP powder Take 255 g by mouth once for 1 dose. 05/20/23 05/20/23 Yes Royanne Foots, DO  sodium phosphate (FLEET) ENEM Place 133 mLs (1 enema total) rectally once for 1 dose. 05/20/23 05/20/23 Yes Royanne Foots, DO  AVAPRO 150 MG tablet Take 1 tablet (150 mg total) by mouth daily. 06/18/22   Corky Crafts, MD  clonazePAM (KLONOPIN) 0.5 MG tablet Take by mouth daily. Patient not taking: Reported on 03/01/2021    [provider]  clopidogrel (PLAVIX) 75 MG tablet Take 1 tablet (75 mg total) by mouth daily. 03/01/21   Corky Crafts, MD  hydrochlorothiazide (MICROZIDE) 12.5 MG capsule Take 1 capsule (12.5 mg total) by mouth daily. 12/01/20   Corky Crafts, MD  methocarbamol (ROBAXIN) 500 MG tablet Take 1 tablet (500 mg total) by mouth every 8 (eight) hours as needed for muscle spasms. Patient not taking: Reported on 03/01/2021 02/21/21   Glynn Octave, MD  metoprolol tartrate (LOPRESSOR) 25 MG tablet Take 25 mg by mouth 2 (two) times daily. Patient not taking: Reported on 03/01/2021    [provider]   rosuvastatin (CRESTOR) 40 MG tablet Take 1 tablet (40 mg total) by mouth daily. Patient not taking: Reported on 03/01/2021 12/01/20   Corky Crafts, MD      Allergies    Penicillins    Review of Systems   Review of Systems  Gastrointestinal:  Positive for constipation.    Physical Exam Updated Vital Signs BP (!) 153/85 (BP Location: Left Arm)   Pulse 80   Temp 98.7 F (37.1 C) (Oral)   Resp 16   Ht 5\' 6"  (1.676 m)   Wt 73 kg   SpO2 96%   BMI 25.99 kg/m  Physical Exam Vitals and nursing note reviewed.  HENT:     Head: Normocephalic and atraumatic.  Eyes:     Pupils: Pupils are equal, round, and reactive to light.  Cardiovascular:     Rate and Rhythm: Normal rate and regular rhythm.  Pulmonary:     Effort: Pulmonary effort is normal.     Breath sounds: Normal breath sounds.  Abdominal:     Comments: Firm abdomen with mild diffuse tenderness no rebound rigidity or guarding  Skin:    General: Skin is warm and dry.  Neurological:     Mental Status: She is alert.  Psychiatric:        Mood and Affect: Mood normal.     ED Results / Procedures / Treatments   Labs (all labs ordered are listed, but only abnormal results are displayed) Labs Reviewed  COMPREHENSIVE METABOLIC PANEL -  Abnormal; Notable for the following components:      Result Value   Glucose, Bld 268 (*)    All other components within normal limits  CBC - Abnormal; Notable for the following components:   RBC 5.89 (*)    Hemoglobin 15.8 (*)    HCT 48.8 (*)    All other components within normal limits  URINALYSIS, ROUTINE W REFLEX MICROSCOPIC - Abnormal; Notable for the following components:   Color, Urine AMBER (*)    APPearance HAZY (*)    Glucose, UA 50 (*)    Ketones, ur 5 (*)    Protein, ur 30 (*)    Bacteria, UA RARE (*)    All other components within normal limits  LIPASE, BLOOD    EKG None  Radiology CT ABDOMEN PELVIS W CONTRAST  Result Date: 05/20/2023 CLINICAL DATA:   Abdomen pain EXAM: CT ABDOMEN AND PELVIS WITH CONTRAST TECHNIQUE: Multidetector CT imaging of the abdomen and pelvis was performed using the standard protocol following bolus administration of intravenous contrast. RADIATION DOSE REDUCTION: This exam was performed according to the departmental dose-optimization program which includes automated exposure control, adjustment of the mA and/or kV according to patient size and/or use of iterative reconstruction technique. CONTRAST:  75mL ISOVUE-370 IOPAMIDOL (ISOVUE-370) INJECTION 76% COMPARISON:  CT 02/21/2021, 02/10/2021 FINDINGS: Lower chest: Lung bases demonstrate calcified granuloma at the right base. Scarring within the left lung base. Stable 7 mm noncalcified left lung base pulmonary nodule, series 4, image 7. New bilobed 9 x 6 mm medial left lung base nodule, series 4, image 5. Coronary vascular calcification. Hepatobiliary: Status post cholecystectomy. No biliary dilatation. Minimal fluid and stranding at the porta hepatis likely residual postoperative change. Pancreas: Unremarkable. No pancreatic ductal dilatation or surrounding inflammatory changes. Spleen: Granuloma Adrenals/Urinary Tract: Right adrenal gland is normal. Left adrenal nodule measuring 1.9 cm with density value of 38.7. This is slightly increased in size compared to prior but adenoma is favored, no specific imaging follow-up is recommended. Kidneys show no hydronephrosis. Small parapelvic cysts for which no imaging follow-up is recommended. The bladder is normal Stomach/Bowel: Stomach is within normal limits. Appendix appears normal. No evidence of bowel wall thickening, distention, or inflammatory changes. Diverticular disease of the left colon. Vascular/Lymphatic: Advanced aortic atherosclerosis. No aneurysm. No suspicious lymph nodes Reproductive: Uterus and bilateral adnexa are unremarkable. Other: Negative for pelvic effusion or free air. Stranding at the umbilicus probably due to recent  cholecystectomy Musculoskeletal: No acute osseous abnormality IMPRESSION: 1. Status post cholecystectomy. Minimal fluid and stranding at the porta hepatis likely residual postoperative change. 2. Diverticular disease of the left colon without acute inflammatory process. 3. Left adrenal nodule measuring 1.9 cm, slightly increased in size compared to prior. This is indeterminate. Recommend further evaluation with adrenal protocol CT or MRI. 4. Aortic atherosclerosis. 5. New bilobed 9 x 6 mm medial left lung base nodule. Non-contrast chest CT at 6-12 months is recommended. If the nodule is stable at time of repeat CT, then future CT at 18-24 months (from today's scan) is considered optional for low-risk patients, but is recommended for high-risk patients. This recommendation follows the consensus statement: Guidelines for Management of Incidental Pulmonary Nodules Detected on CT Images: From the Fleischner Society 2017; Radiology 2017; 284:228-243. Aortic Atherosclerosis (ICD10-I70.0). Electronically Signed   By: Jasmine Pang M.D.   On: 05/20/2023 22:57    Procedures Procedures    Medications Ordered in ED Medications  sodium phosphate (FLEET) enema 1 enema (has no administration in time  range)  magnesium citrate solution 1 Bottle (has no administration in time range)  iopamidol (ISOVUE-370) 76 % injection 75 mL (75 mLs Intravenous Contrast Given 05/20/23 2008)  morphine (PF) 4 MG/ML injection 4 mg (4 mg Intravenous Given 05/20/23 2009)  sodium chloride 0.9 % bolus 1,000 mL (0 mLs Intravenous Stopped 05/20/23 2237)  ondansetron (ZOFRAN) injection 4 mg (4 mg Intravenous Given 05/20/23 2013)    ED Course/ Medical Decision Making/ A&P Clinical Course as of 05/20/23 2335  Tue May 20, 2023  2309 CT shows no acute intra-abdominal pathology.  Postoperative changes status post cholecystectomy.  Incidental finding of adrenal and pulmonary nodules will require future follow-up.  No evidence of UTI on  urinalysis.  CBC and CMP show no leukocytosis, significant anemia or other metabolic disturbance.  Lipase within normal limits. [MP]  2332 Reevaluated patient.  She reports improvement in her abdominal discomfort.  She has not been taking MiraLAX or other laxatives for her constipation at home.  Will send her home with MiraLAX and Fleet enema.  Return precautions discussed in detail. [MP]    Clinical Course User Index [MP] Royanne Foots, DO                                 Medical Decision Making 72 year old female with history as above including recent cholecystectomy presents for postoperative abdominal pain and constipation.  No significant bowel movements in the last week.  She is passing gas and able to eat but has had some episodes of nausea and vomiting.  Afebrile slightly hypertensive here.  Exam notable for firm but not rigid abdomen with diffuse mild tenderness.  Presentation would be mostly consistent with postoperative obstipation in the setting of tramadol use.  Will obtain laboratory workup along with CT abdomen pelvis given her history of recent abdominal surgery to look for postoperative complications such as intra-abdominal infection.  Will provide pain management with morphine and attempt escalation of bowel regimen with magnesium citrate and Fleet enema.  Amount and/or Complexity of Data Reviewed Labs: ordered. Radiology: ordered.  Risk OTC drugs. Prescription drug management.           Final Clinical Impression(s) / ED Diagnoses Final diagnoses:  Constipation, unspecified constipation type    Rx / DC Orders ED Discharge Orders          Ordered    sodium phosphate (FLEET) ENEM   Once        05/20/23 2334    polyethylene glycol powder (GLYCOLAX/MIRALAX) 17 GM/SCOOP powder   Once        05/20/23 2334              Royanne Foots, DO 05/20/23 2335

## 2023-05-20 NOTE — ED Triage Notes (Signed)
Patient with constipation (no BM in 8 days) and generalized abdominal pain x 6 days s/p cholecystectomy at [facility] on 11/2. Taking Tramadol for pain.
